# Patient Record
Sex: Female | Born: 1970 | Race: White | Hispanic: No | Marital: Married | Smoking: Never smoker
Health system: Southern US, Community
[De-identification: ages and names within clinical notes are randomized; demographics above are authoritative.]

## PROBLEM LIST (undated history)

## (undated) DIAGNOSIS — Z01419 Encounter for gynecological examination (general) (routine) without abnormal findings: Secondary | ICD-10-CM

## (undated) DIAGNOSIS — R739 Hyperglycemia, unspecified: Secondary | ICD-10-CM

## (undated) DIAGNOSIS — E039 Hypothyroidism, unspecified: Secondary | ICD-10-CM

## (undated) HISTORY — DX: Hypothyroidism, unspecified: E03.9

## (undated) HISTORY — DX: Encounter for gynecological examination (general) (routine) without abnormal findings: Z01.419

## (undated) HISTORY — DX: Hyperglycemia, unspecified: R73.9

---

## 1999-06-11 HISTORY — PX: COLONOSCOPY: SHX174

## 1999-06-11 LAB — HM COLONOSCOPY

## 2004-05-02 ENCOUNTER — Ambulatory Visit: Payer: Self-pay | Admitting: Family Medicine

## 2004-05-11 ENCOUNTER — Ambulatory Visit: Payer: Self-pay | Admitting: Family Medicine

## 2004-06-26 ENCOUNTER — Other Ambulatory Visit: Admission: RE | Admit: 2004-06-26 | Discharge: 2004-06-26 | Payer: Self-pay | Admitting: Obstetrics and Gynecology

## 2004-07-04 ENCOUNTER — Ambulatory Visit: Payer: Self-pay | Admitting: Family Medicine

## 2004-08-29 ENCOUNTER — Ambulatory Visit: Payer: Self-pay | Admitting: Family Medicine

## 2004-11-08 ENCOUNTER — Other Ambulatory Visit: Admission: RE | Admit: 2004-11-08 | Discharge: 2004-11-08 | Payer: Self-pay | Admitting: Obstetrics and Gynecology

## 2005-04-18 ENCOUNTER — Ambulatory Visit: Payer: Self-pay | Admitting: Family Medicine

## 2005-05-22 ENCOUNTER — Other Ambulatory Visit: Admission: RE | Admit: 2005-05-22 | Discharge: 2005-05-22 | Payer: Self-pay | Admitting: Obstetrics and Gynecology

## 2006-04-17 ENCOUNTER — Ambulatory Visit: Payer: Self-pay | Admitting: Family Medicine

## 2007-04-08 ENCOUNTER — Ambulatory Visit: Payer: Self-pay | Admitting: Family Medicine

## 2007-11-12 ENCOUNTER — Ambulatory Visit: Payer: Self-pay | Admitting: Internal Medicine

## 2007-11-21 ENCOUNTER — Emergency Department (HOSPITAL_COMMUNITY): Admission: EM | Admit: 2007-11-21 | Discharge: 2007-11-21 | Payer: Self-pay | Admitting: Emergency Medicine

## 2007-12-21 ENCOUNTER — Ambulatory Visit: Payer: Self-pay | Admitting: Family Medicine

## 2007-12-21 DIAGNOSIS — I803 Phlebitis and thrombophlebitis of lower extremities, unspecified: Secondary | ICD-10-CM | POA: Insufficient documentation

## 2007-12-30 ENCOUNTER — Encounter: Payer: Self-pay | Admitting: Family Medicine

## 2007-12-30 ENCOUNTER — Ambulatory Visit: Payer: Self-pay

## 2008-01-26 ENCOUNTER — Ambulatory Visit: Payer: Self-pay | Admitting: Family Medicine

## 2008-01-26 LAB — CONVERTED CEMR LAB
Bilirubin Urine: NEGATIVE
Glucose, Urine, Semiquant: NEGATIVE
Ketones, urine, test strip: NEGATIVE
Nitrite: NEGATIVE
Specific Gravity, Urine: 1.015
pH: 5.5

## 2008-01-28 LAB — CONVERTED CEMR LAB
Albumin: 4.2 g/dL (ref 3.5–5.2)
Alkaline Phosphatase: 36 units/L — ABNORMAL LOW (ref 39–117)
BUN: 9 mg/dL (ref 6–23)
Cholesterol: 133 mg/dL (ref 0–200)
Creatinine, Ser: 0.7 mg/dL (ref 0.4–1.2)
Eosinophils Absolute: 0.3 10*3/uL (ref 0.0–0.7)
Eosinophils Relative: 4.8 % (ref 0.0–5.0)
GFR calc Af Amer: 122 mL/min
GFR calc non Af Amer: 101 mL/min
HCT: 38.7 % (ref 36.0–46.0)
HDL: 37.3 mg/dL — ABNORMAL LOW (ref >39.0)
MCHC: 34.6 g/dL (ref 30.0–36.0)
MCV: 88.5 fL (ref 78.0–100.0)
Monocytes Absolute: 0.4 10*3/uL (ref 0.1–1.0)
Platelets: 153 10*3/uL (ref 150–400)
Potassium: 4.4 meq/L (ref 3.5–5.1)
TSH: 5.01 microintl units/mL (ref 0.35–5.50)
Triglycerides: 67 mg/dL (ref 0–149)
WBC: 6.1 10*3/uL (ref 4.5–10.5)

## 2008-02-02 ENCOUNTER — Ambulatory Visit: Payer: Self-pay | Admitting: Family Medicine

## 2008-03-10 ENCOUNTER — Ambulatory Visit: Payer: Self-pay | Admitting: Family Medicine

## 2008-06-09 ENCOUNTER — Telehealth: Payer: Self-pay | Admitting: Family Medicine

## 2008-06-09 ENCOUNTER — Emergency Department (HOSPITAL_COMMUNITY): Admission: EM | Admit: 2008-06-09 | Discharge: 2008-06-09 | Payer: Self-pay | Admitting: Internal Medicine

## 2008-06-09 ENCOUNTER — Encounter: Payer: Self-pay | Admitting: Family Medicine

## 2008-06-21 ENCOUNTER — Telehealth: Payer: Self-pay | Admitting: Family Medicine

## 2008-06-21 ENCOUNTER — Ambulatory Visit: Payer: Self-pay | Admitting: Family Medicine

## 2008-06-21 DIAGNOSIS — J189 Pneumonia, unspecified organism: Secondary | ICD-10-CM | POA: Insufficient documentation

## 2008-06-24 ENCOUNTER — Telehealth: Payer: Self-pay | Admitting: Family Medicine

## 2009-01-20 ENCOUNTER — Ambulatory Visit: Payer: Self-pay | Admitting: Family Medicine

## 2009-01-20 DIAGNOSIS — R079 Chest pain, unspecified: Secondary | ICD-10-CM | POA: Insufficient documentation

## 2009-01-25 ENCOUNTER — Ambulatory Visit: Payer: Self-pay | Admitting: Cardiology

## 2009-01-25 LAB — CONVERTED CEMR LAB
ALT: 15 units/L (ref 0–35)
Basophils Relative: 0.1 % (ref 0.0–3.0)
Chloride: 103 meq/L (ref 96–112)
Eosinophils Relative: 1 % (ref 0.0–5.0)
HCT: 40.9 % (ref 36.0–46.0)
Hemoglobin: 13.7 g/dL (ref 12.0–15.0)
Lymphs Abs: 1.3 10*3/uL (ref 0.7–4.0)
MCV: 89.8 fL (ref 78.0–100.0)
Monocytes Absolute: 0.3 10*3/uL (ref 0.1–1.0)
Potassium: 4.9 meq/L (ref 3.5–5.1)
RBC: 4.55 M/uL (ref 3.87–5.11)
TSH: 2.21 microintl units/mL (ref 0.35–5.50)
Total Protein: 7.4 g/dL (ref 6.0–8.3)
Vitamin B-12: 558 pg/mL (ref 211–911)
WBC: 7 10*3/uL (ref 4.5–10.5)

## 2009-01-30 ENCOUNTER — Encounter: Admission: RE | Admit: 2009-01-30 | Discharge: 2009-01-30 | Payer: Self-pay | Admitting: Family Medicine

## 2009-01-30 LAB — HM MAMMOGRAPHY: HM Mammogram: NEGATIVE

## 2009-08-31 ENCOUNTER — Ambulatory Visit: Payer: Self-pay | Admitting: Family Medicine

## 2009-08-31 DIAGNOSIS — IMO0001 Reserved for inherently not codable concepts without codable children: Secondary | ICD-10-CM | POA: Insufficient documentation

## 2009-09-01 ENCOUNTER — Ambulatory Visit: Payer: Self-pay | Admitting: Family Medicine

## 2009-09-05 LAB — CONVERTED CEMR LAB
Albumin: 4.9 g/dL (ref 3.5–5.2)
Alkaline Phosphatase: 38 units/L — ABNORMAL LOW (ref 39–117)
BUN: 11 mg/dL (ref 6–23)
Basophils Absolute: 0 10*3/uL (ref 0.0–0.1)
Basophils Relative: 1 % (ref 0–1)
Calcium: 9.3 mg/dL (ref 8.4–10.5)
Creatinine, Ser: 0.64 mg/dL (ref 0.40–1.20)
Eosinophils Relative: 4 % (ref 0–5)
Glucose, Bld: 128 mg/dL — ABNORMAL HIGH (ref 70–99)
HCT: 40.7 % (ref 36.0–46.0)
Lymphocytes Relative: 33 % (ref 12–46)
MCHC: 33.2 g/dL (ref 30.0–36.0)
Platelets: 182 10*3/uL (ref 150–400)
RDW: 12.6 % (ref 11.5–15.5)
Sodium: 141 meq/L (ref 135–145)
TSH: 4.733 microintl units/mL — ABNORMAL HIGH (ref 0.350–4.500)
Total Bilirubin: 0.4 mg/dL (ref 0.3–1.2)
Total CK: 42 units/L (ref 7–177)
Total Protein: 7.5 g/dL (ref 6.0–8.3)
Vitamin B-12: 672 pg/mL (ref 211–911)

## 2009-10-03 ENCOUNTER — Ambulatory Visit: Payer: Self-pay | Admitting: Family Medicine

## 2009-10-03 DIAGNOSIS — H6690 Otitis media, unspecified, unspecified ear: Secondary | ICD-10-CM | POA: Insufficient documentation

## 2009-10-09 ENCOUNTER — Telehealth: Payer: Self-pay | Admitting: Family Medicine

## 2009-11-20 IMAGING — CR DG CHEST 2V
2 series · 2 of 2 positions shown · non-contrast
Comparison: 11/21/2007

CLINICAL DATA: Fever, sore throat, cough

CHEST - 2 VIEW

[w chest pa]
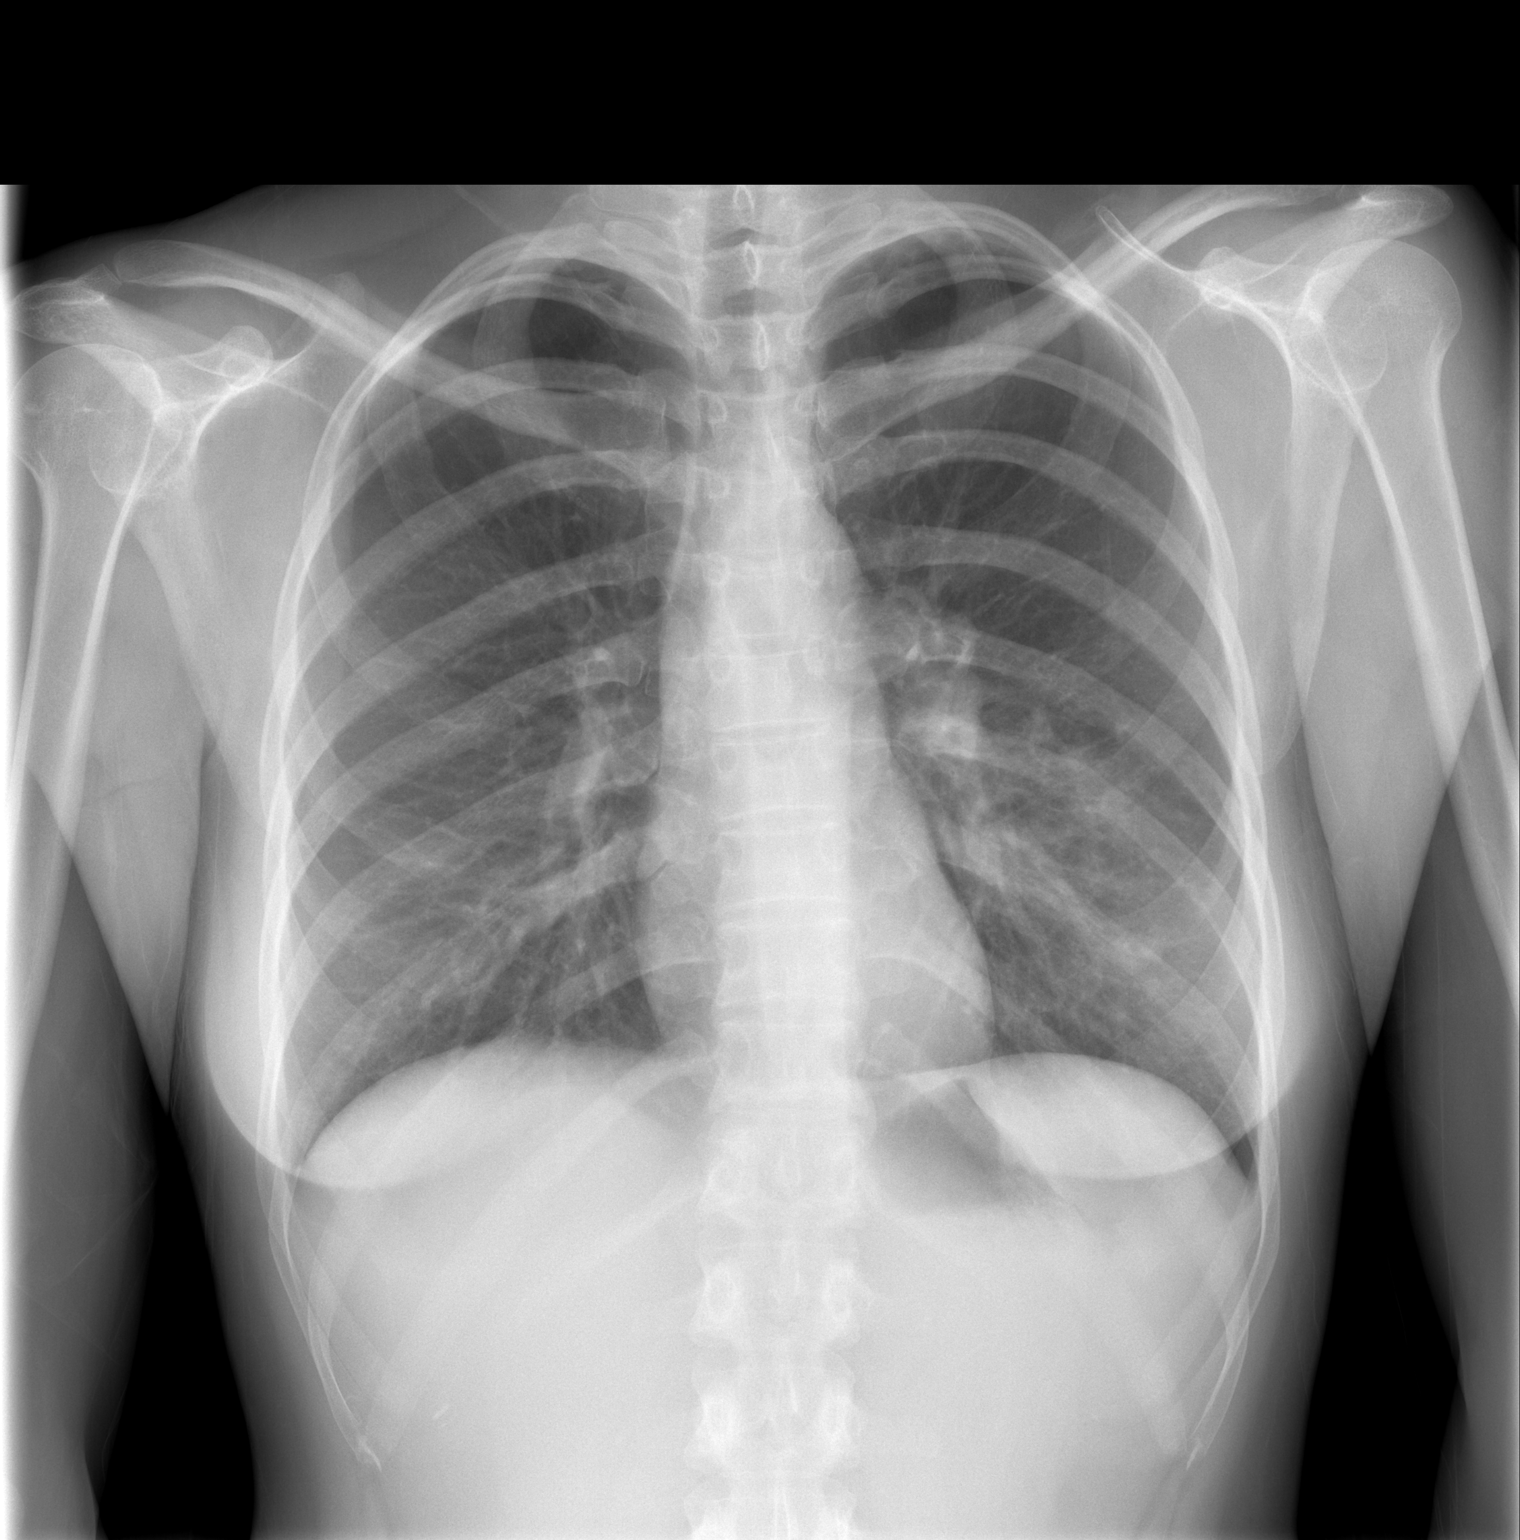

[w chest lat]
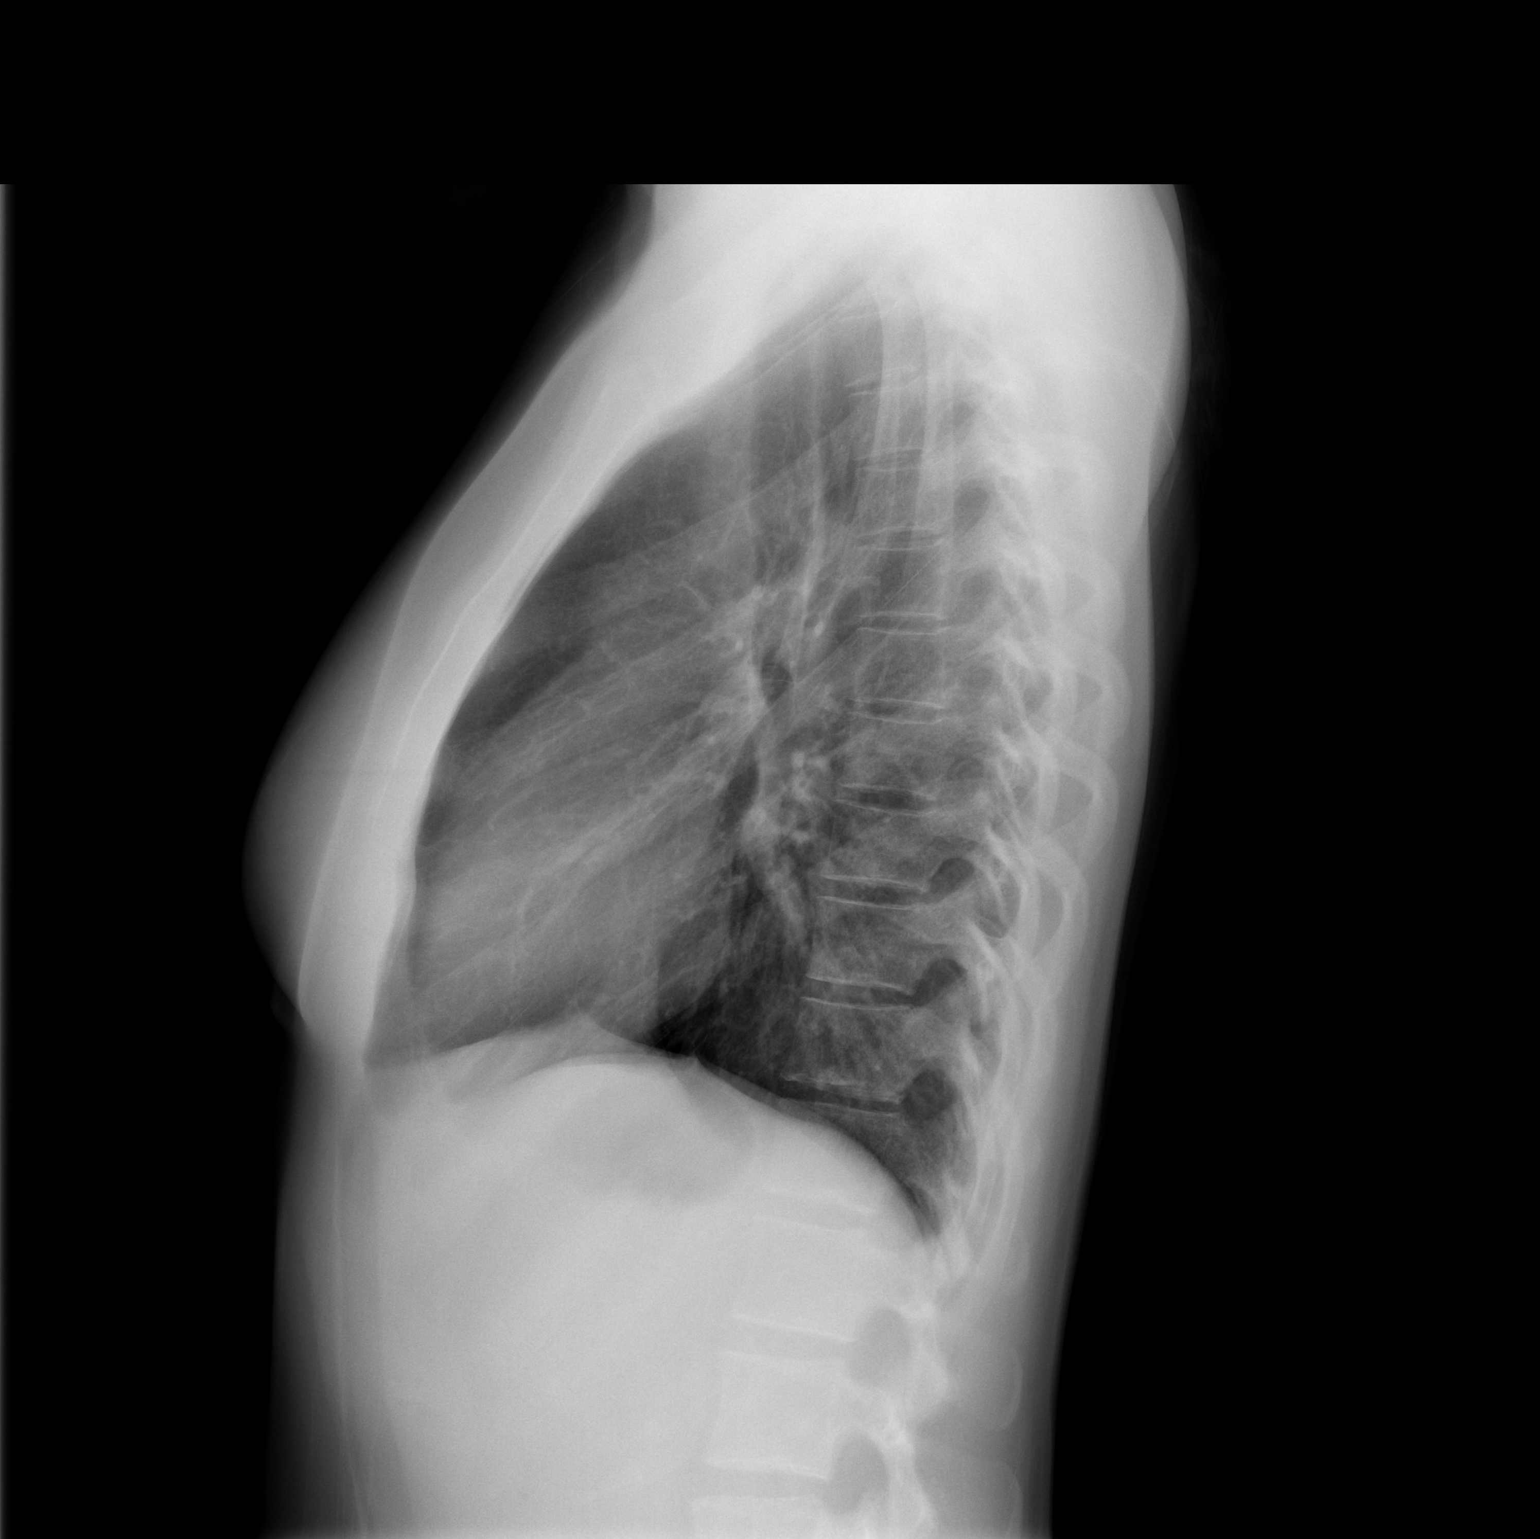

[2 of 2 positions shown; findings below may reference images not displayed]

FINDINGS: Cardiomediastinal silhouette is stable.  There is mild
central increased bronchial markings without peribronchial
thickening.  There is patchy haziness in the lingula suspicious for
early infiltrate. No pleural effusion or pulmonary edema.
IMPRESSION: Patchy haziness is noted in the lingula suspicious for early
infiltrate.  Mild central increased bronchial markings without
peribronchial thickening.

## 2009-11-27 ENCOUNTER — Ambulatory Visit: Payer: Self-pay | Admitting: Family Medicine

## 2009-11-28 LAB — CONVERTED CEMR LAB
TSH: 1.22 microintl units/mL (ref 0.35–5.50)
Vit D, 25-Hydroxy: 44 ng/mL (ref 30–89)

## 2009-11-29 ENCOUNTER — Telehealth: Payer: Self-pay | Admitting: Family Medicine

## 2010-03-15 ENCOUNTER — Telehealth: Payer: Self-pay | Admitting: Family Medicine

## 2010-07-11 NOTE — Progress Notes (Signed)
Summary: Levothyroxine?  Phone Note Call from Patient Call back at [facility] Phone [phone] Call back at Work Phone [phone]   Caller: Patient Call For: Nelwyn Salisbury MD Summary of Call: Pt is asking how long she should be losing this amount of hair loss on Levothyroxine???? Initial call taken by: Lynann Beaver CMA,  March 15, 2010 10:39 AM  Follow-up for Phone Call        I am not sure, but her thyroid level is good. She may want to talk to Dr. Vincente Poli about checking her hormone levels  Follow-up by: Nelwyn Salisbury MD,  March 16, 2010 8:43 AM  Additional Follow-up for Phone Call Additional follow up Details #1::        Pt. notified. Additional Follow-up by: Lynann Beaver CMA,  March 16, 2010 8:51 AM

## 2010-07-11 NOTE — Progress Notes (Signed)
Summary: ear still clogged.  Phone Note Call from Patient   Caller: Patient Call For: Nelwyn Salisbury MD Summary of Call: [phone] Ear is still clogged, and wants to know if this is normal.  Concerned about it lasting this long. Initial call taken by: Lynann Beaver CMA,  Oct 09, 2009 10:30 AM  Follow-up for Phone Call        It should be better by now, so Ceftin  is not working. Stop this and  try Levaquin 500 mg once daily for 10 days. Please call this in Follow-up by: Nelwyn Salisbury MD,  Oct 09, 2009 12:52 PM  Additional Follow-up for Phone Call Additional follow up Details #1::        Rx Called In, patient notified. Additional Follow-up by: Raechel Ache, RN,  Oct 09, 2009 1:27 PM

## 2010-07-11 NOTE — Assessment & Plan Note (Signed)
Summary: F/U ON ARM/NECK PAIN // RS   Vital Signs:  Patient profile:   40 year old female Weight:      145 pounds BMI:     22.13 Temp:     97.9 degrees F oral BP sitting:   110 / 76  (left arm) Cuff size:   regular  Vitals Entered By: Raechel Ache, RN (August 31, 2009 8:27 AM) CC: F/u; c/o pains in gland areas- neck, axilla and groin.   History of Present Illness: Here for continued intermittent vague pains in the left neck and left axilla and left groin which have persisted for the past year. We worked this up last August with labs, a mammogram, and even a chest CT. All this was normal. These pains are mild, never severe. She has never taken anything for them. She has never felt a discrete lump anywhere. She is very frustrated and a bit alarmed at what these could be coming from. never ever bothers her on the right side of her body. No sweats or fevers or rashes. her weight is steady.   Allergies: 1)  ! Penicillin V Potassium (Penicillin V Potassium)  Past History:  Past Medical History: Reviewed history from 02/02/2008 and no changes required. Unremarkable sees Dr. Vincente Poli for gyn exams  Past Surgical History: Reviewed history from 02/02/2008 and no changes required. Colonoscopy-2001 revealed internal hemorrhoids only  Review of Systems  The patient denies anorexia, fever, weight loss, weight gain, vision loss, decreased hearing, hoarseness, chest pain, syncope, dyspnea on exertion, peripheral edema, prolonged cough, headaches, hemoptysis, abdominal pain, melena, hematochezia, severe indigestion/heartburn, hematuria, incontinence, genital sores, muscle weakness, suspicious skin lesions, transient blindness, difficulty walking, depression, unusual weight change, abnormal bleeding, enlarged lymph nodes, angioedema, breast masses, and testicular masses.    Physical Exam  General:  Well-developed,well-nourished,in no acute distress; alert,appropriate and cooperative throughout  examination Neck:  No deformities, masses, or tenderness noted. Lungs:  Normal respiratory effort, chest expands symmetrically. Lungs are clear to auscultation, no crackles or wheezes. Heart:  Normal rate and regular rhythm. S1 and S2 normal without gallop, murmur, click, rub or other extra sounds. Abdomen:  Bowel sounds positive,abdomen soft and non-tender without masses, organomegaly or hernias noted. Skin:  Intact without suspicious lesions or rashes Cervical Nodes:  No lymphadenopathy noted Axillary Nodes:  No palpable lymphadenopathy Inguinal Nodes:  No significant adenopathy   Impression & Recommendations:  Problem # 1:  MYALGIA (ICD-729.1)  Patient Instructions: 1)  It is very difficult to say what the etiology of this could be. We have never felt any true lymphadenopathy, although the areas where she is symptomatic seem to be where lymph nodes predominate. She will return tomorrow for a number of labs to evaluate further.

## 2010-07-11 NOTE — Progress Notes (Signed)
Summary: returning a call  Phone Note Call from Patient Call back at Work Phone [phone]   Caller: Patient---live call Summary of Call: returning a call. Initial call taken by: Warnell Forester,  November 29, 2009 12:49 PM  Follow-up for Phone Call        Phone Call Completed Follow-up by: Raechel Ache, RN,  November 29, 2009 1:19 PM    Prescriptions: SYNTHROID 50 MCG TABS (LEVOTHYROXINE SODIUM) 1 once daily  #90 x 3   Entered by:   Raechel Ache, RN   Authorized by:   Nelwyn Salisbury MD   Signed by:   Raechel Ache, RN on 11/29/2009   Method used:   [facility]  [place] [address] (retail[facility]       [address]       [place], [place]  [address]       Ph: [phone] or [phone]       Fax: [phone]   RxID:   [id number]

## 2010-07-11 NOTE — Assessment & Plan Note (Signed)
Summary: LG FEVER, ST, EAR PAIN // RS   Vital Signs:  Patient profile:   40 year old female Weight:      147 pounds Temp:     98.7 degrees F oral BP sitting:   114 / 86  (left arm) Cuff size:   regular  Vitals Entered By: Raechel Ache, RN (October 03, 2009 11:34 AM) CC: C/o congestion, R earache, sore throat, little cough.   History of Present Illness: Here for one week of HA, fever, ST, and a dry cough, most of which has resolved. Now she also has a painful right ear. On Sudafed and Motrin.   Allergies: 1)  ! Penicillin V Potassium (Penicillin V Potassium)  Past History:  Past Medical History: Reviewed history from 02/02/2008 and no changes required. Unremarkable sees Dr. Vincente Poli for gyn exams  Past Surgical History: Reviewed history from 02/02/2008 and no changes required. Colonoscopy-2001 revealed internal hemorrhoids only  Review of Systems  The patient denies anorexia, weight loss, weight gain, vision loss, decreased hearing, hoarseness, chest pain, syncope, dyspnea on exertion, peripheral edema, hemoptysis, abdominal pain, melena, hematochezia, severe indigestion/heartburn, hematuria, incontinence, genital sores, muscle weakness, suspicious skin lesions, transient blindness, difficulty walking, depression, unusual weight change, abnormal bleeding, enlarged lymph nodes, angioedema, breast masses, and testicular masses.    Physical Exam  General:  Well-developed,well-nourished,in no acute distress; alert,appropriate and cooperative throughout examination Head:  Normocephalic and atraumatic without obvious abnormalities. No apparent alopecia or balding. Eyes:  No corneal or conjunctival inflammation noted. EOMI. Perrla. Funduscopic exam benign, without hemorrhages, exudates or papilledema. Vision grossly normal. Ears:  right TM is red and dull, left is clear Nose:  External nasal examination shows no deformity or inflammation. Nasal mucosa are pink and moist without  lesions or exudates. Mouth:  Oral mucosa and oropharynx without lesions or exudates.  Teeth in good repair. Neck:  No deformities, masses, or tenderness noted. Lungs:  Normal respiratory effort, chest expands symmetrically. Lungs are clear to auscultation, no crackles or wheezes.   Impression & Recommendations:  Problem # 1:  OTITIS MEDIA (ICD-382.9)  Her updated medication list for this problem includes:    Ceftin 500 Mg Tabs (Cefuroxime axetil) .Marland Kitchen..Marland Kitchen Two times a day  Complete Medication List: 1)  Vitamin D (ergocalciferol) 50000 Unit Caps (Ergocalciferol) .Marland Kitchen.. 1 q week 2)  Synthroid 50 Mcg Tabs (Levothyroxine sodium) .Marland Kitchen.. 1 once daily 3)  Ceftin 500 Mg Tabs (Cefuroxime axetil) .... Two times a day  Patient Instructions: 1)  Please schedule a follow-up appointment as needed .  Prescriptions: CEFTIN 500 MG TABS (CEFUROXIME AXETIL) two times a day  #20 x 0   Entered and Authorized by:   Nelwyn Salisbury MD   Signed by:   Nelwyn Salisbury MD on 10/03/2009   Method used:   [facility]        [facility]  [place] [address] [facility]       [address]       [place], [place]  [address]       Ph: [phone] or [phone]       Fax: [phone]   RxID:   [id number]

## 2010-07-13 IMAGING — MG MM DIAGNOSTIC BILATERAL
5 series · 5 of 5 positions shown · non-contrast
Comparison: None.

CLINICAL DATA: Intermittent bilateral, nonfocal breast pain,
greater on the left.

DIGITAL DIAGNOSTIC BILATERAL MAMMOGRAM WITH CAD

[R CC (1 of 2)]
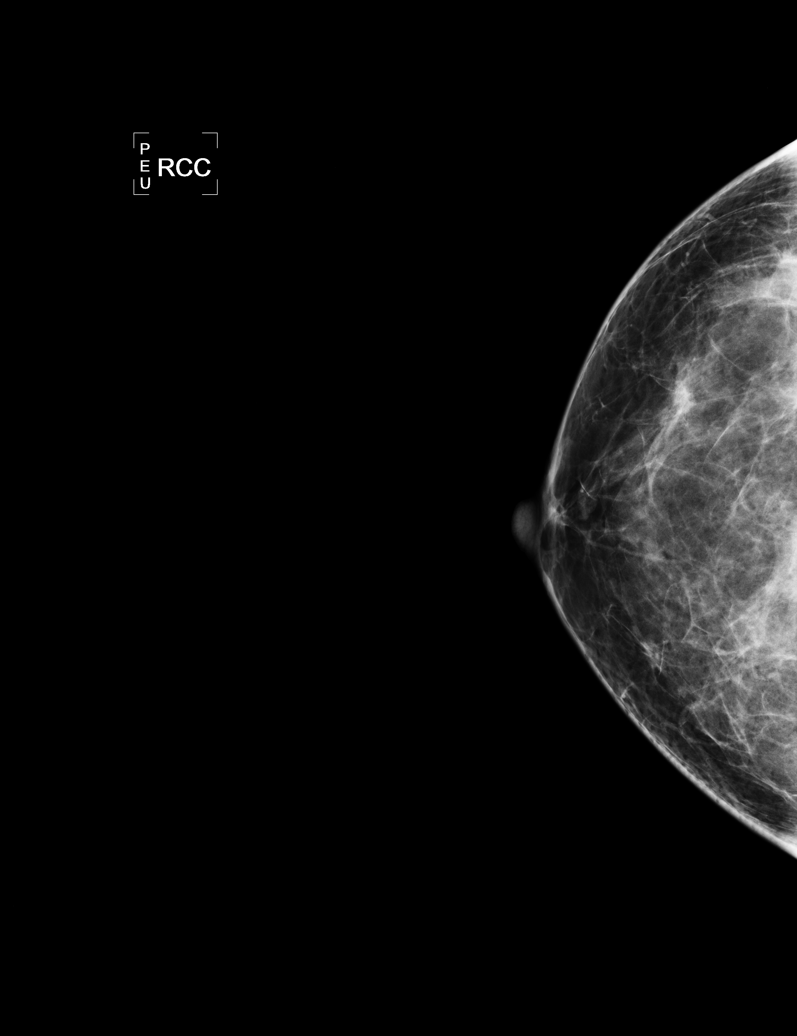

[L CC]
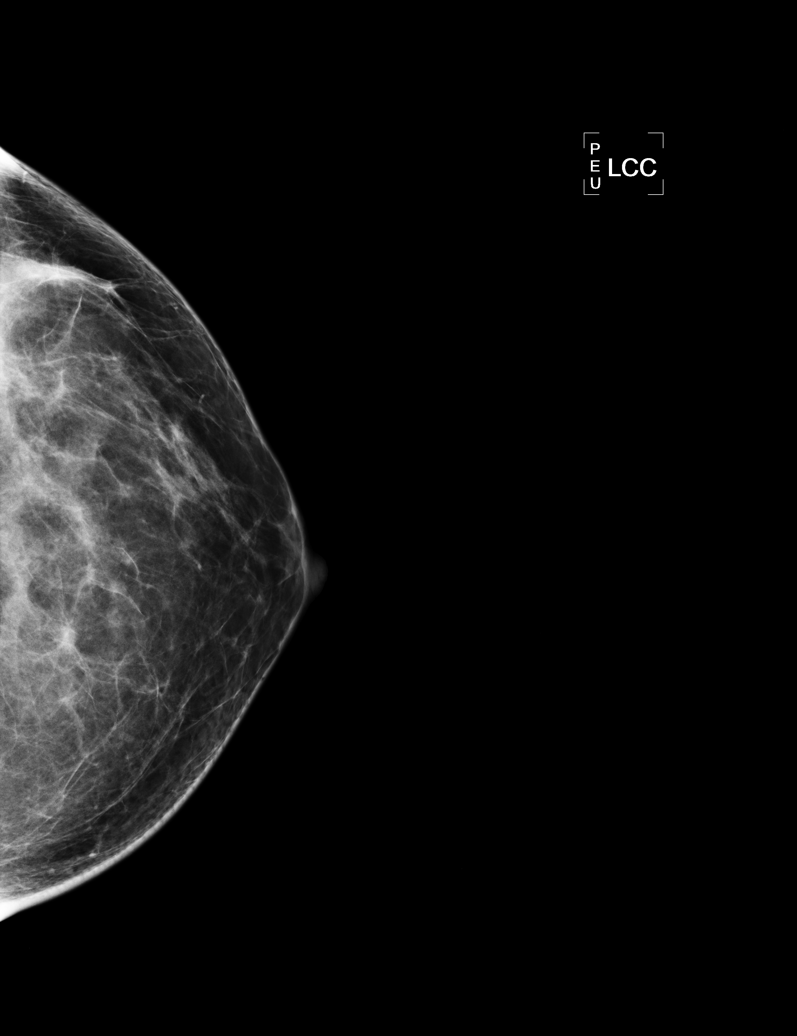

[L MLO]
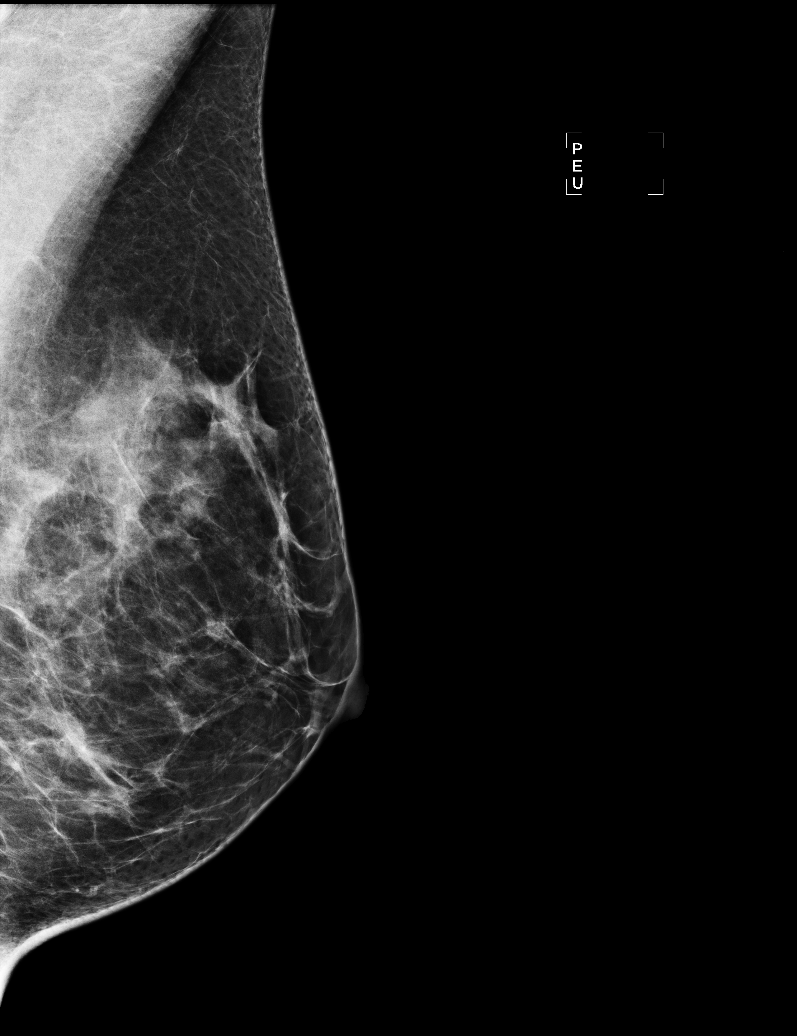

[R MLO]
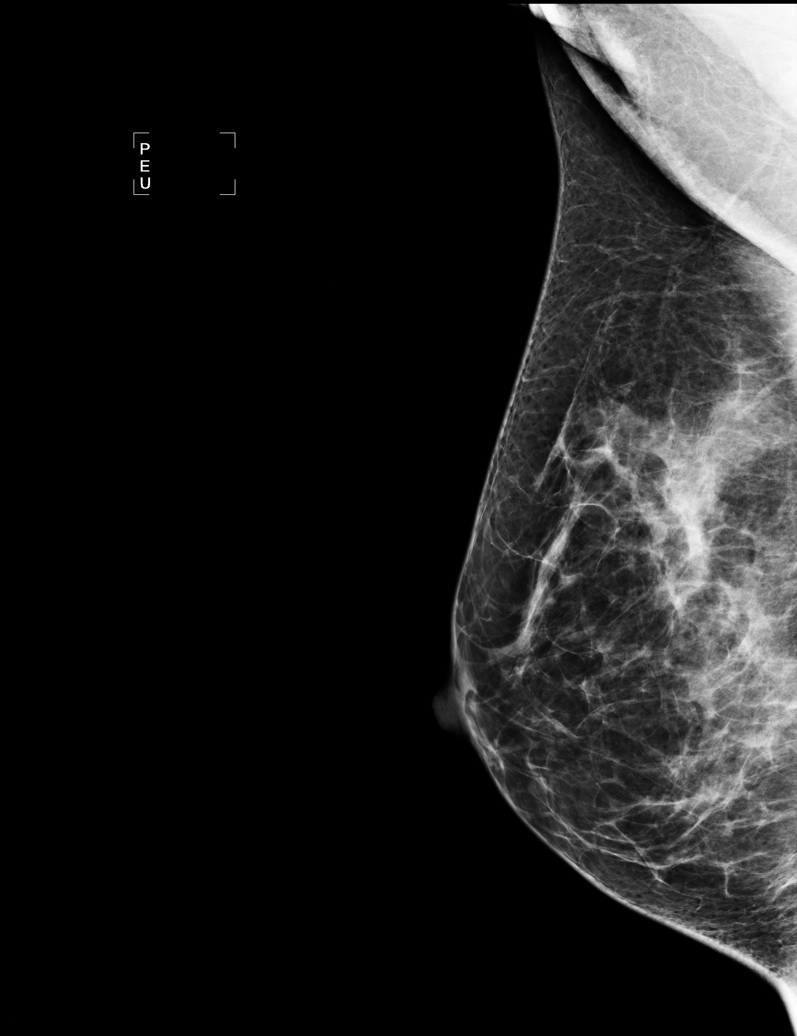

[R CC (2 of 2)]
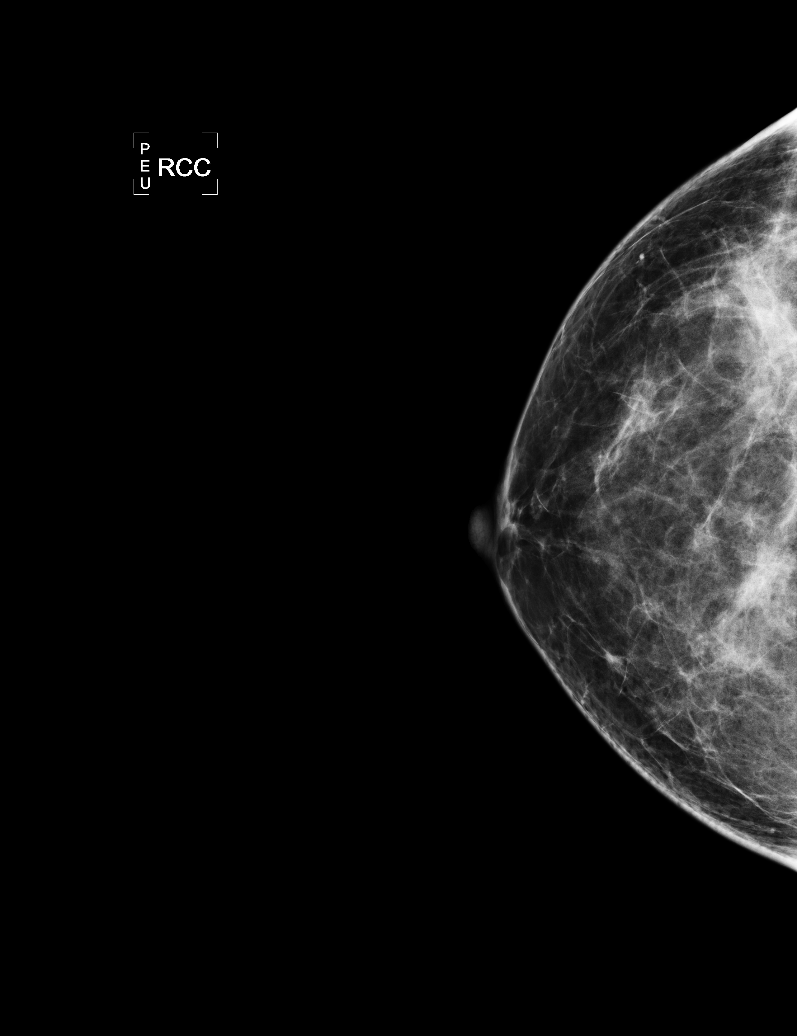

[5 of 5 positions shown; findings below may reference images not displayed]

FINDINGS: Scattered fibroglandular tissue in both breasts with no
mammographic findings suspicious for malignancy.
Mammographic images were processed with CAD.
IMPRESSION: No evidence of malignancy.  Annual screening mammography is
recommended beginning at age 40.

BI-RADS CATEGORY 1:  Negative.

## 2010-11-28 ENCOUNTER — Other Ambulatory Visit (INDEPENDENT_AMBULATORY_CARE_PROVIDER_SITE_OTHER): Payer: BC Managed Care – PPO | Admitting: Family Medicine

## 2010-11-28 DIAGNOSIS — Z0000 Encounter for general adult medical examination without abnormal findings: Secondary | ICD-10-CM

## 2010-11-28 LAB — POCT URINALYSIS DIPSTICK
Bilirubin, UA: NEGATIVE
Glucose, UA: NEGATIVE
Ketones, UA: NEGATIVE
Nitrite, UA: NEGATIVE
pH, UA: 5.5

## 2010-11-28 LAB — CBC WITH DIFFERENTIAL/PLATELET
Basophils Relative: 0.4 % (ref 0.0–3.0)
Eosinophils Absolute: 0.2 10*3/uL (ref 0.0–0.7)
Eosinophils Relative: 3.6 % (ref 0.0–5.0)
Hemoglobin: 13.9 g/dL (ref 12.0–15.0)
MCHC: 33.7 g/dL (ref 30.0–36.0)
MCV: 89.7 fl (ref 78.0–100.0)
Monocytes Absolute: 0.3 10*3/uL (ref 0.1–1.0)
Neutro Abs: 4 10*3/uL (ref 1.4–7.7)
Neutrophils Relative %: 61.3 % (ref 43.0–77.0)
RBC: 4.61 Mil/uL (ref 3.87–5.11)
WBC: 6.5 10*3/uL (ref 4.5–10.5)

## 2010-11-28 LAB — HEPATIC FUNCTION PANEL
ALT: 18 U/L (ref 0–35)
Albumin: 4.5 g/dL (ref 3.5–5.2)
Alkaline Phosphatase: 35 U/L — ABNORMAL LOW (ref 39–117)
Bilirubin, Direct: 0.1 mg/dL (ref 0.0–0.3)
Total Protein: 7 g/dL (ref 6.0–8.3)

## 2010-11-28 LAB — BASIC METABOLIC PANEL
CO2: 29 mEq/L (ref 19–32)
Chloride: 104 mEq/L (ref 96–112)
Potassium: 5.6 mEq/L — ABNORMAL HIGH (ref 3.5–5.1)
Sodium: 140 mEq/L (ref 135–145)

## 2010-11-28 LAB — LIPID PANEL
HDL: 43.2 mg/dL (ref >39.00)
Total CHOL/HDL Ratio: 4

## 2010-11-28 LAB — TSH: TSH: 3.18 u[IU]/mL (ref 0.35–5.50)

## 2010-11-30 ENCOUNTER — Encounter: Payer: Self-pay | Admitting: *Deleted

## 2010-12-05 ENCOUNTER — Encounter: Payer: Self-pay | Admitting: Family Medicine

## 2010-12-05 ENCOUNTER — Ambulatory Visit (INDEPENDENT_AMBULATORY_CARE_PROVIDER_SITE_OTHER): Payer: BC Managed Care – PPO | Admitting: Family Medicine

## 2010-12-05 VITALS — BP 112/82 | HR 84 | Temp 98.2°F | Ht 68.0 in | Wt 140.0 lb

## 2010-12-05 DIAGNOSIS — Z0000 Encounter for general adult medical examination without abnormal findings: Secondary | ICD-10-CM

## 2010-12-05 DIAGNOSIS — E039 Hypothyroidism, unspecified: Secondary | ICD-10-CM

## 2010-12-05 MED ORDER — FLUTICASONE PROPIONATE 50 MCG/ACT NA SUSP: 2.0000 | Freq: Every day | NASAL | Status: DC

## 2010-12-05 MED ORDER — SYNTHROID 50 MCG PO TABS: 50.0000 ug | ORAL_TABLET | Freq: Every day | ORAL | Status: DC

## 2010-12-05 NOTE — Progress Notes (Signed)
  Subjective:    Patient ID: Caitlin Russell, female    DOB: December 24, 1970, 40 y.o.   MRN: [id number]  HPI 40 yr old female for a cpx. She is doing well in general although her allergies are acting up a bit. She has a stuffy head and nose, and she gets PND at times. No fever or cough.    Review of Systems  Constitutional: Negative.   HENT: Negative.   Eyes: Negative.   Respiratory: Negative.   Cardiovascular: Negative.   Gastrointestinal: Negative.   Genitourinary: Negative for dysuria, urgency, frequency, hematuria, flank pain, decreased urine volume, enuresis, difficulty urinating, pelvic pain and dyspareunia.  Musculoskeletal: Negative.   Skin: Negative.   Neurological: Negative.   Hematological: Negative.   Psychiatric/Behavioral: Negative.        Objective:   Physical Exam  Constitutional: She is oriented to person, place, and time. She appears well-developed and well-nourished. No distress.  HENT:  Head: Normocephalic and atraumatic.  Right Ear: External ear normal.  Left Ear: External ear normal.  Nose: Nose normal.  Mouth/Throat: Oropharynx is clear and moist. No oropharyngeal exudate.  Eyes: Conjunctivae and EOM are normal. Pupils are equal, round, and reactive to light. No scleral icterus.  Neck: Normal range of motion. Neck supple. No JVD present. No thyromegaly present.  Cardiovascular: Normal rate, regular rhythm, normal heart sounds and intact distal pulses.  Exam reveals no gallop and no friction rub.   No murmur heard. Pulmonary/Chest: Effort normal and breath sounds normal. No respiratory distress. She has no wheezes. She has no rales. She exhibits no tenderness.  Abdominal: Soft. Bowel sounds are normal. She exhibits no distension and no mass. There is no tenderness. There is no rebound and no guarding.  Musculoskeletal: Normal range of motion. She exhibits no edema and no tenderness.  Lymphadenopathy:    She has no cervical adenopathy.  Neurological: She is  alert and oriented to person, place, and time. She has normal reflexes. No cranial nerve deficit. She exhibits normal muscle tone. Coordination normal.  Skin: Skin is warm and dry. No rash noted. No erythema.  Psychiatric: She has a normal mood and affect. Her behavior is normal. Judgment and thought content normal.          Assessment & Plan:  Get more exercise. Try Flonase  sprays

## 2011-03-15 LAB — URINALYSIS, ROUTINE W REFLEX MICROSCOPIC
Ketones, ur: 40 mg/dL — AB
Nitrite: NEGATIVE
pH: 5.5 (ref 5.0–8.0)

## 2011-03-15 LAB — DIFFERENTIAL
Basophils Absolute: 0 10*3/uL (ref 0.0–0.1)
Basophils Relative: 0 % (ref 0–1)
Lymphocytes Relative: 5 % — ABNORMAL LOW (ref 12–46)
Monocytes Absolute: 0.3 10*3/uL (ref 0.1–1.0)
Neutro Abs: 9.1 10*3/uL — ABNORMAL HIGH (ref 1.7–7.7)
Neutrophils Relative %: 92 % — ABNORMAL HIGH (ref 43–77)

## 2011-03-15 LAB — CBC
MCHC: 33.6 g/dL (ref 30.0–36.0)
Platelets: 189 10*3/uL (ref 150–400)
RDW: 13 % (ref 11.5–15.5)

## 2011-03-15 LAB — URINE MICROSCOPIC-ADD ON

## 2011-03-15 LAB — URINE CULTURE: Colony Count: >=100000

## 2011-03-15 LAB — POCT I-STAT, CHEM 8
BUN: 7 mg/dL (ref 6–23)
Calcium, Ion: 1.07 mmol/L — ABNORMAL LOW (ref 1.12–1.32)
Chloride: 99 mEq/L (ref 96–112)
HCT: 45 % (ref 36.0–46.0)
Potassium: 3.3 mEq/L — ABNORMAL LOW (ref 3.5–5.1)
Sodium: 136 mEq/L (ref 135–145)

## 2011-03-15 LAB — RAPID STREP SCREEN (MED CTR MEBANE ONLY): Streptococcus, Group A Screen (Direct): NEGATIVE

## 2011-03-15 LAB — D-DIMER, QUANTITATIVE: D-Dimer, Quant: 0.42 ug/mL-FEU (ref 0.00–0.48)

## 2011-11-19 ENCOUNTER — Other Ambulatory Visit: Payer: Self-pay | Admitting: Family Medicine

## 2011-11-19 NOTE — Telephone Encounter (Signed)
Can we refill this? 

## 2012-01-08 ENCOUNTER — Other Ambulatory Visit (INDEPENDENT_AMBULATORY_CARE_PROVIDER_SITE_OTHER): Payer: BC Managed Care – PPO

## 2012-01-08 DIAGNOSIS — Z0000 Encounter for general adult medical examination without abnormal findings: Secondary | ICD-10-CM

## 2012-01-08 LAB — LIPID PANEL
Cholesterol: 130 mg/dL (ref 0–200)
HDL: 44.1 mg/dL (ref >39.00)
LDL Cholesterol: 79 mg/dL (ref 0–99)
Total CHOL/HDL Ratio: 3
Triglycerides: 34 mg/dL (ref 0.0–149.0)
VLDL: 6.8 mg/dL (ref 0.0–40.0)

## 2012-01-08 LAB — HEPATIC FUNCTION PANEL
ALT: 23 U/L (ref 0–35)
AST: 18 U/L (ref 0–37)
Alkaline Phosphatase: 30 U/L — ABNORMAL LOW (ref 39–117)
Bilirubin, Direct: 0.1 mg/dL (ref 0.0–0.3)
Total Protein: 6.9 g/dL (ref 6.0–8.3)

## 2012-01-08 LAB — CBC WITH DIFFERENTIAL/PLATELET
Basophils Relative: 0.5 % (ref 0.0–3.0)
Eosinophils Relative: 4.3 % (ref 0.0–5.0)
Lymphocytes Relative: 32.5 % (ref 12.0–46.0)
Monocytes Relative: 5.9 % (ref 3.0–12.0)
Neutrophils Relative %: 56.8 % (ref 43.0–77.0)
RBC: 4.4 Mil/uL (ref 3.87–5.11)
WBC: 5.4 10*3/uL (ref 4.5–10.5)

## 2012-01-08 LAB — BASIC METABOLIC PANEL
Calcium: 8.9 mg/dL (ref 8.4–10.5)
Chloride: 103 mEq/L (ref 96–112)
Creatinine, Ser: 0.8 mg/dL (ref 0.4–1.2)
Sodium: 138 mEq/L (ref 135–145)

## 2012-01-13 NOTE — Progress Notes (Signed)
Quick Note:  I spoke with pt ______ 

## 2012-01-14 ENCOUNTER — Encounter: Payer: Self-pay | Admitting: Family Medicine

## 2012-01-14 ENCOUNTER — Ambulatory Visit (INDEPENDENT_AMBULATORY_CARE_PROVIDER_SITE_OTHER): Payer: BC Managed Care – PPO | Admitting: Family Medicine

## 2012-01-14 VITALS — BP 102/66 | HR 83 | Temp 98.3°F | Ht 68.5 in | Wt 132.0 lb

## 2012-01-14 DIAGNOSIS — Z0000 Encounter for general adult medical examination without abnormal findings: Secondary | ICD-10-CM

## 2012-01-14 NOTE — Progress Notes (Signed)
  Subjective:    Patient ID: Caitlin Russell, female    DOB: Jun 30, 1970, 41 y.o.   MRN: [id number]  HPI 40 yr old female for a cpx. She feels fine and has no concerns.    Review of Systems  Constitutional: Negative.   HENT: Negative.   Eyes: Negative.   Respiratory: Negative.   Cardiovascular: Negative.   Gastrointestinal: Negative.   Genitourinary: Negative for dysuria, urgency, frequency, hematuria, flank pain, decreased urine volume, enuresis, difficulty urinating, pelvic pain and dyspareunia.  Musculoskeletal: Negative.   Skin: Negative.   Neurological: Negative.   Hematological: Negative.   Psychiatric/Behavioral: Negative.        Objective:   Physical Exam  Constitutional: She is oriented to person, place, and time. She appears well-developed and well-nourished. No distress.  HENT:  Head: Normocephalic and atraumatic.  Right Ear: External ear normal.  Left Ear: External ear normal.  Nose: Nose normal.  Mouth/Throat: Oropharynx is clear and moist. No oropharyngeal exudate.  Eyes: Conjunctivae and EOM are normal. Pupils are equal, round, and reactive to light. No scleral icterus.  Neck: Normal range of motion. Neck supple. No JVD present. No thyromegaly present.  Cardiovascular: Normal rate, regular rhythm, normal heart sounds and intact distal pulses.  Exam reveals no gallop and no friction rub.   No murmur heard. Pulmonary/Chest: Effort normal and breath sounds normal. No respiratory distress. She has no wheezes. She has no rales. She exhibits no tenderness.  Abdominal: Soft. Bowel sounds are normal. She exhibits no distension and no mass. There is no tenderness. There is no rebound and no guarding.  Musculoskeletal: Normal range of motion. She exhibits no edema and no tenderness.  Lymphadenopathy:    She has no cervical adenopathy.  Neurological: She is alert and oriented to person, place, and time. She has normal reflexes. No cranial nerve deficit. She exhibits  normal muscle tone. Coordination normal.  Skin: Skin is warm and dry. No rash noted. No erythema.  Psychiatric: She has a normal mood and affect. Her behavior is normal. Judgment and thought content normal.          Assessment & Plan:  Well exam.

## 2012-03-10 ENCOUNTER — Ambulatory Visit (INDEPENDENT_AMBULATORY_CARE_PROVIDER_SITE_OTHER): Payer: BC Managed Care – PPO | Admitting: Family Medicine

## 2012-03-10 ENCOUNTER — Encounter: Payer: Self-pay | Admitting: Family Medicine

## 2012-03-10 VITALS — BP 102/60 | HR 104 | Temp 98.5°F | Wt 137.0 lb

## 2012-03-10 DIAGNOSIS — M545 Low back pain, unspecified: Secondary | ICD-10-CM

## 2012-03-10 DIAGNOSIS — Z23 Encounter for immunization: Secondary | ICD-10-CM

## 2012-03-10 DIAGNOSIS — M25559 Pain in unspecified hip: Secondary | ICD-10-CM

## 2012-03-10 MED ORDER — DICLOFENAC SODIUM 75 MG PO TBEC: 75.0000 mg | DELAYED_RELEASE_TABLET | Freq: Two times a day (BID) | ORAL | Status: DC

## 2012-03-10 NOTE — Progress Notes (Signed)
  Subjective:    Patient ID: Caitlin Russell, female    DOB: 08-Apr-1971, 41 y.o.   MRN: [id number]  HPI Here for several pains that started about 6 weeks ago. Around this time she and her husband were spending a lot of time digging trenches in their yard for some pipes to be laid. Since then she has had a recurrent dull pain in the lower back and some stiffness. This does not radiate to the legs. No numbness or weakness in the legs. Advil relieves this pain for several hours. She also notes a sharp intermittent pain in the left anterior hip region. No recent trauma.    Review of Systems  Constitutional: Negative.   Musculoskeletal: Positive for back pain and arthralgias.       Objective:   Physical Exam  Constitutional: She appears well-developed and well-nourished.  Musculoskeletal:       Mildly tender in the lower back especially over the sacroiliac joints. The spine shows full ROM. Negative SLR. She is also tender over the left anterior superior iliac spine. Both hips show full ROM without pain.           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 Rest, ice. Try Diclofenac bid for several weeks. Recheck if this does not help.

## 2012-04-23 ENCOUNTER — Other Ambulatory Visit: Payer: Self-pay | Admitting: Family Medicine

## 2012-07-17 ENCOUNTER — Encounter: Payer: Self-pay | Admitting: Family Medicine

## 2012-07-17 ENCOUNTER — Ambulatory Visit (INDEPENDENT_AMBULATORY_CARE_PROVIDER_SITE_OTHER): Payer: BC Managed Care – PPO | Admitting: Family Medicine

## 2012-07-17 VITALS — BP 110/64 | HR 88 | Temp 98.4°F | Wt 138.0 lb

## 2012-07-17 DIAGNOSIS — M542 Cervicalgia: Secondary | ICD-10-CM

## 2012-07-17 DIAGNOSIS — R1032 Left lower quadrant pain: Secondary | ICD-10-CM

## 2012-07-17 MED ORDER — CYCLOBENZAPRINE HCL 10 MG PO TABS: 10.0000 mg | ORAL_TABLET | Freq: Three times a day (TID) | ORAL | Status: DC | PRN

## 2012-07-17 NOTE — Progress Notes (Signed)
  Subjective:    Patient ID: Caitlin Russell, female    DOB: 10/04/1970, 42 y.o.   MRN: [id number]  HPI Here for 2 things. First she has had stiffness and pain in there neck for several weeks. This started in the back of the neck but now has mover around to the left side. No masses or swelling. No pain on chewing. No HAs or ST. She has been under a lot of stress this winter. She and her husband opened up their home to her mother-in-law who was dying of cancer. She was enrolled in Hospice and lived with them for 7 weeks before she passed away last month. Needless to say this was stressful for everyone. She has taken Advil a few times for this and it helps. Also several days ago she had a sharp but mild pain appear in the LLQ of her abdomen. This lasted about 2 days and now today is has almost resolved. No change in bowel habits or urinations. No fever or nausea. Her LMP was 2 weeks ago.    Review of Systems  Constitutional: Negative.   HENT: Positive for neck pain and neck stiffness.   Eyes: Negative.   Respiratory: Negative.   Gastrointestinal: Positive for abdominal pain. Negative for nausea, vomiting, diarrhea, constipation, blood in stool and abdominal distention.  Genitourinary: Negative.        Objective:   Physical Exam  Constitutional: She appears well-developed and well-nourished. No distress.  HENT:  Head: Normocephalic and atraumatic.  Right Ear: External ear normal.  Left Ear: External ear normal.  Mouth/Throat: Oropharynx is clear and moist. No oropharyngeal exudate.  Eyes: Conjunctivae normal are normal. Pupils are equal, round, and reactive to light.  Neck: Normal range of motion. Neck supple. No thyromegaly present.       Tender along the left sternocleidomastoid muscle, no masses or adenopathy felt   Abdominal: Soft. Bowel sounds are normal. She exhibits no distension and no mass. There is no tenderness. There is no rebound and no guarding.  Lymphadenopathy:    She has  no cervical adenopathy.          Assessment & Plan:  The neck pain is probably secondary to stress and possibly sleeping in a strange position. Use moist heat and Diclofenac. Given Flexeril to use at bedtime every night, and possibly during the day as well. Try getting a massage. Her abdominal pain was likely ovulation pain and it seems to have resolved. Recheck prn

## 2012-09-30 ENCOUNTER — Ambulatory Visit (INDEPENDENT_AMBULATORY_CARE_PROVIDER_SITE_OTHER): Payer: BC Managed Care – PPO | Admitting: Family Medicine

## 2012-09-30 ENCOUNTER — Encounter: Payer: Self-pay | Admitting: Family Medicine

## 2012-09-30 ENCOUNTER — Telehealth: Payer: Self-pay | Admitting: Family Medicine

## 2012-09-30 VITALS — BP 112/70 | HR 98 | Temp 98.4°F | Wt 134.0 lb

## 2012-09-30 DIAGNOSIS — R1032 Left lower quadrant pain: Secondary | ICD-10-CM

## 2012-09-30 NOTE — Telephone Encounter (Signed)
Had to reorder ultrasound of the pelvis, per Dr. Clent Ridges request.

## 2012-09-30 NOTE — Addendum Note (Signed)
Addended by: Gershon Crane A on: 09/30/2012 01:38 PM   Modules accepted: Orders

## 2012-09-30 NOTE — Progress Notes (Signed)
  Subjective:    Patient ID: Caitlin Russell, female    DOB: 1971-03-22, 42 y.o.   MRN: [id number]  HPI Here for ongoing LLQ pains that started about 2 and 1/2 months ago. She mentioned this to me in February, and they had started 2 days prior to that. We decided to follow up prn. The pains have persisted intermittently, and there are days at a time when she feels nothing at all. Other times she feels a moderate pain in the area, never severe. This seems to be worst around a week after each menstrual cycle. Her last MP was April 14-17. No urinary or bowel symptoms. No fevers or nausea. She is scheduled to have her yearly GYN exam with Dr. Vincente Poli in June.    Review of Systems  Constitutional: Negative.   Respiratory: Negative.   Cardiovascular: Negative.   Gastrointestinal: Positive for abdominal pain. Negative for nausea, vomiting, diarrhea, constipation, blood in stool, abdominal distention and rectal pain.  Genitourinary: Negative.        Objective:   Physical Exam  Constitutional: She appears well-developed and well-nourished. No distress.  Pulmonary/Chest: Effort normal and breath sounds normal.  Abdominal: Soft. Bowel sounds are normal. She exhibits no distension and no mass. There is no rebound and no guarding.  Slightly tender in the LLQ          Assessment & Plan:  This is most consistent with an ovarian cyst. We will set up a transvaginal pelvic US to evaluate.

## 2012-10-05 ENCOUNTER — Ambulatory Visit
Admission: RE | Admit: 2012-10-05 | Discharge: 2012-10-05 | Disposition: A | Discharge status: Home / Self Care | Payer: BC Managed Care – PPO | Source: Ambulatory Visit | Attending: Family Medicine | Admitting: Family Medicine

## 2012-10-05 DIAGNOSIS — R1032 Left lower quadrant pain: Secondary | ICD-10-CM

## 2012-10-06 NOTE — Progress Notes (Signed)
pti nformed and will let us know if she decides to go to [facility]

## 2012-11-09 ENCOUNTER — Telehealth: Payer: Self-pay | Admitting: Family Medicine

## 2012-11-09 MED ORDER — SYNTHROID 50 MCG PO TABS: 50.0000 ug | ORAL_TABLET | Freq: Every day | ORAL | Status: DC

## 2012-11-09 NOTE — Telephone Encounter (Signed)
Refill request for Synthroid a 90 day supply and I did send script e-scribe.

## 2013-01-28 ENCOUNTER — Other Ambulatory Visit (INDEPENDENT_AMBULATORY_CARE_PROVIDER_SITE_OTHER): Payer: BC Managed Care – PPO

## 2013-01-28 DIAGNOSIS — Z0000 Encounter for general adult medical examination without abnormal findings: Secondary | ICD-10-CM

## 2013-01-28 LAB — LIPID PANEL
Total CHOL/HDL Ratio: 3
Triglycerides: 47 mg/dL (ref 0.0–149.0)

## 2013-01-28 LAB — HEPATIC FUNCTION PANEL
AST: 15 U/L (ref 0–37)
Albumin: 4.3 g/dL (ref 3.5–5.2)
Total Protein: 7.3 g/dL (ref 6.0–8.3)

## 2013-01-28 LAB — POCT URINALYSIS DIPSTICK
Glucose, UA: NEGATIVE
Nitrite, UA: NEGATIVE
Protein, UA: NEGATIVE
Urobilinogen, UA: 0.2
pH, UA: 5.5

## 2013-01-28 LAB — CBC WITH DIFFERENTIAL/PLATELET
Basophils Absolute: 0 10*3/uL (ref 0.0–0.1)
HCT: 41.9 % (ref 36.0–46.0)
Hemoglobin: 14.1 g/dL (ref 12.0–15.0)
Lymphs Abs: 2.1 10*3/uL (ref 0.7–4.0)
MCHC: 33.7 g/dL (ref 30.0–36.0)
Monocytes Relative: 6.2 % (ref 3.0–12.0)
Neutro Abs: 2.9 10*3/uL (ref 1.4–7.7)
RDW: 12.9 % (ref 11.5–14.6)

## 2013-01-28 LAB — BASIC METABOLIC PANEL
CO2: 30 mEq/L (ref 19–32)
Glucose, Bld: 99 mg/dL (ref 70–99)
Potassium: 4.9 mEq/L (ref 3.5–5.1)
Sodium: 137 mEq/L (ref 135–145)

## 2013-01-28 LAB — TSH: TSH: 3.72 u[IU]/mL (ref 0.35–5.50)

## 2013-02-03 ENCOUNTER — Encounter: Payer: Self-pay | Admitting: Family Medicine

## 2013-02-03 ENCOUNTER — Ambulatory Visit (INDEPENDENT_AMBULATORY_CARE_PROVIDER_SITE_OTHER): Payer: BC Managed Care – PPO | Admitting: Family Medicine

## 2013-02-03 VITALS — BP 110/60 | HR 81 | Temp 98.3°F | Ht 68.5 in | Wt 134.0 lb

## 2013-02-03 DIAGNOSIS — Z0000 Encounter for general adult medical examination without abnormal findings: Secondary | ICD-10-CM

## 2013-02-03 NOTE — Progress Notes (Signed)
  Subjective:    Patient ID: Caitlin Russell, female    DOB: 04/04/1971, 42 y.o.   MRN: [id number]  HPI 42 yr old female for a cpx. She is doing well.    Review of Systems  Constitutional: Negative.   HENT: Negative.   Eyes: Negative.   Respiratory: Negative.   Cardiovascular: Negative.   Gastrointestinal: Negative.   Genitourinary: Negative for dysuria, urgency, frequency, hematuria, flank pain, decreased urine volume, enuresis, difficulty urinating, pelvic pain and dyspareunia.  Musculoskeletal: Negative.   Skin: Negative.   Neurological: Negative.   Psychiatric/Behavioral: Negative.        Objective:   Physical Exam  Constitutional: She is oriented to person, place, and time. She appears well-developed and well-nourished. No distress.  HENT:  Head: Normocephalic and atraumatic.  Right Ear: External ear normal.  Left Ear: External ear normal.  Nose: Nose normal.  Mouth/Throat: Oropharynx is clear and moist. No oropharyngeal exudate.  Eyes: Conjunctivae and EOM are normal. Pupils are equal, round, and reactive to light. No scleral icterus.  Neck: Normal range of motion. Neck supple. No JVD present. No thyromegaly present.  Cardiovascular: Normal rate, regular rhythm, normal heart sounds and intact distal pulses.  Exam reveals no gallop and no friction rub.   No murmur heard. Pulmonary/Chest: Effort normal and breath sounds normal. No respiratory distress. She has no wheezes. She has no rales. She exhibits no tenderness.  Abdominal: Soft. Bowel sounds are normal. She exhibits no distension and no mass. There is no tenderness. There is no rebound and no guarding.  Musculoskeletal: Normal range of motion. She exhibits no edema and no tenderness.  Lymphadenopathy:    She has no cervical adenopathy.  Neurological: She is alert and oriented to person, place, and time. She has normal reflexes. No cranial nerve deficit. She exhibits normal muscle tone. Coordination normal.  Skin:  Skin is warm and dry. No rash noted. No erythema.  Psychiatric: She has a normal mood and affect. Her behavior is normal. Judgment and thought content normal.          Assessment & Plan:  Well exam.

## 2013-02-09 ENCOUNTER — Other Ambulatory Visit: Payer: Self-pay | Admitting: Family Medicine

## 2013-04-15 ENCOUNTER — Other Ambulatory Visit: Payer: Self-pay

## 2013-08-03 ENCOUNTER — Ambulatory Visit: Payer: BC Managed Care – PPO | Admitting: Family Medicine

## 2013-08-04 ENCOUNTER — Ambulatory Visit (INDEPENDENT_AMBULATORY_CARE_PROVIDER_SITE_OTHER): Payer: BC Managed Care – PPO | Admitting: Family Medicine

## 2013-08-04 ENCOUNTER — Encounter: Payer: Self-pay | Admitting: Family Medicine

## 2013-08-04 VITALS — BP 102/60 | HR 97 | Temp 98.6°F | Ht 68.5 in | Wt 140.0 lb

## 2013-08-04 DIAGNOSIS — M65849 Other synovitis and tenosynovitis, unspecified hand: Secondary | ICD-10-CM

## 2013-08-04 DIAGNOSIS — K148 Other diseases of tongue: Secondary | ICD-10-CM

## 2013-08-04 DIAGNOSIS — M778 Other enthesopathies, not elsewhere classified: Secondary | ICD-10-CM

## 2013-08-04 DIAGNOSIS — M65839 Other synovitis and tenosynovitis, unspecified forearm: Secondary | ICD-10-CM

## 2013-08-04 NOTE — Progress Notes (Signed)
Pre visit review using our clinic review tool, if applicable. No additional management support is needed unless otherwise documented below in the visit note. 

## 2013-08-04 NOTE — Progress Notes (Signed)
   Subjective:    Patient ID: Caitlin Russell, female    DOB: [birth year], 43 y.o.   MRN: [id number]  HPI Here for 2 things. First she has had a red spot on her tongue for the past 4 weeks. No hx of trauma. Sometimes it is sore,often it is not. The redness gets more intense at times and more dull at times. The size is not changing. Also for 3 months she has had intermittent pain in the right wrist. No hx of trauma. No swelling. The pain goes away if she wears a wrist brace for a few days but then it returns.    Review of Systems  Constitutional: Negative.   HENT: Positive for mouth sores.   Musculoskeletal: Positive for arthralgias.       Objective:   Physical Exam  Constitutional: She appears well-developed and well-nourished.  HENT:  There is a macular bright red area on the center of the top of the tongue, centered over the midline. papillation is normal. This is blanchable and not tender   Musculoskeletal:  The right wrist has a tender spot on the dorsal surface, no swelling. Full ROM.   Lymphadenopathy:    She has no cervical adenopathy.          Assessment & Plan:  She has tendonitis of the wrist and this should go away with sufficient immobilization. She will wear the splint 24 hours a day for 2 full weeks. Recheck prn. The tongue lesion seems to be hypervascular but I am not sure what this is. We will refer to ENT

## 2014-01-30 ENCOUNTER — Other Ambulatory Visit: Payer: Self-pay | Admitting: Family Medicine

## 2014-03-09 ENCOUNTER — Ambulatory Visit (INDEPENDENT_AMBULATORY_CARE_PROVIDER_SITE_OTHER): Payer: BC Managed Care – PPO | Admitting: Family Medicine

## 2014-03-09 DIAGNOSIS — Z23 Encounter for immunization: Secondary | ICD-10-CM

## 2014-05-02 ENCOUNTER — Telehealth: Payer: Self-pay | Admitting: Family Medicine

## 2014-05-02 NOTE — Telephone Encounter (Signed)
okay

## 2014-05-02 NOTE — Telephone Encounter (Signed)
FYIPt is coming in for blood work and request a refill on synthroid. Please do not refill until after blood work results are back. Pt is thinking med may need to be adjusted.

## 2014-05-04 ENCOUNTER — Other Ambulatory Visit (INDEPENDENT_AMBULATORY_CARE_PROVIDER_SITE_OTHER): Payer: BC Managed Care – PPO

## 2014-05-04 DIAGNOSIS — Z0000 Encounter for general adult medical examination without abnormal findings: Secondary | ICD-10-CM

## 2014-05-04 LAB — CBC WITH DIFFERENTIAL/PLATELET
BASOS ABS: 0 10*3/uL (ref 0.0–0.1)
Basophils Relative: 0.4 % (ref 0.0–3.0)
Eosinophils Absolute: 0.3 10*3/uL (ref 0.0–0.7)
Eosinophils Relative: 4 % (ref 0.0–5.0)
HCT: 41.5 % (ref 36.0–46.0)
Hemoglobin: 13.8 g/dL (ref 12.0–15.0)
LYMPHS ABS: 1.8 10*3/uL (ref 0.7–4.0)
LYMPHS PCT: 24.2 % (ref 12.0–46.0)
MCHC: 33.1 g/dL (ref 30.0–36.0)
MCV: 89.2 fl (ref 78.0–100.0)
MONOS PCT: 6.3 % (ref 3.0–12.0)
Monocytes Absolute: 0.5 10*3/uL (ref 0.1–1.0)
NEUTROS PCT: 65.1 % (ref 43.0–77.0)
Neutro Abs: 4.8 10*3/uL (ref 1.4–7.7)
PLATELETS: 183 10*3/uL (ref 150.0–400.0)
RBC: 4.65 Mil/uL (ref 3.87–5.11)
RDW: 13.1 % (ref 11.5–15.5)
WBC: 7.4 10*3/uL (ref 4.0–10.5)

## 2014-05-04 LAB — LIPID PANEL
CHOL/HDL RATIO: 3
Cholesterol: 125 mg/dL (ref 0–200)
HDL: 38.1 mg/dL — AB (ref >39.00)
LDL Cholesterol: 74 mg/dL (ref 0–99)
NonHDL: 86.9
TRIGLYCERIDES: 65 mg/dL (ref 0.0–149.0)
VLDL: 13 mg/dL (ref 0.0–40.0)

## 2014-05-04 LAB — BASIC METABOLIC PANEL
BUN: 12 mg/dL (ref 6–23)
CALCIUM: 9.2 mg/dL (ref 8.4–10.5)
CO2: 28 mEq/L (ref 19–32)
CREATININE: 0.7 mg/dL (ref 0.4–1.2)
Chloride: 102 mEq/L (ref 96–112)
GFR: 100.25 mL/min (ref >60.00)
GLUCOSE: 98 mg/dL (ref 70–99)
Potassium: 4.8 mEq/L (ref 3.5–5.1)
Sodium: 138 mEq/L (ref 135–145)

## 2014-05-04 LAB — HEPATIC FUNCTION PANEL
ALK PHOS: 30 U/L — AB (ref 39–117)
ALT: 15 U/L (ref 0–35)
AST: 14 U/L (ref 0–37)
Albumin: 4.5 g/dL (ref 3.5–5.2)
BILIRUBIN DIRECT: 0.1 mg/dL (ref 0.0–0.3)
BILIRUBIN TOTAL: 0.9 mg/dL (ref 0.2–1.2)
Total Protein: 7.3 g/dL (ref 6.0–8.3)

## 2014-05-04 LAB — TSH: TSH: 4.2 u[IU]/mL (ref 0.35–4.50)

## 2014-05-11 ENCOUNTER — Encounter: Payer: Self-pay | Admitting: Family Medicine

## 2014-05-11 ENCOUNTER — Ambulatory Visit (INDEPENDENT_AMBULATORY_CARE_PROVIDER_SITE_OTHER): Payer: BC Managed Care – PPO | Admitting: Family Medicine

## 2014-05-11 VITALS — BP 123/74 | HR 78 | Temp 99.0°F | Ht 68.5 in | Wt 136.0 lb

## 2014-05-11 DIAGNOSIS — Z0000 Encounter for general adult medical examination without abnormal findings: Secondary | ICD-10-CM

## 2014-05-11 LAB — POCT URINALYSIS DIPSTICK
BILIRUBIN UA: NEGATIVE
GLUCOSE UA: NEGATIVE
KETONES UA: NEGATIVE
NITRITE UA: NEGATIVE
Protein, UA: NEGATIVE
RBC UA: NEGATIVE
Spec Grav, UA: <=1.005
Urobilinogen, UA: 0.2
pH, UA: 5.5

## 2014-05-11 MED ORDER — SYNTHROID 50 MCG PO TABS: ORAL_TABLET | ORAL | Status: DC

## 2014-05-11 NOTE — Progress Notes (Signed)
   Subjective:    Patient ID: Caitlin Russell, female    DOB: [birth year], 43 y.o.   MRN: [id number]  HPI 42 yr old female here for a cpx. She has a few items to discuss. She asks about what she can do to help her lower back. About one month ago while lifting a bag of landscaping stones into her car she felt a sharp pain in the lower back and her back stiffened up later that day. That night she had more severe pains which were helped a bit with Ibuprofen, heat, and Flexeril. The pain slowly went away over the next week, but then a week after that it came back. While stepping [place], she again felt a sudden severe pain in the lower back, and again this slowly went away over the next few days. Today she feels fine.   Review of Systems  Constitutional: Negative.   HENT: Positive for ear discharge.   Eyes: Negative.   Respiratory: Negative.   Cardiovascular: Negative.   Gastrointestinal: Negative.   Genitourinary: Negative for dysuria, urgency, frequency, hematuria, flank pain, decreased urine volume, enuresis, difficulty urinating, pelvic pain and dyspareunia.  Musculoskeletal: Positive for back pain. Negative for myalgias, joint swelling, arthralgias, gait problem, neck pain and neck stiffness.  Skin: Negative.   Neurological: Negative.   Psychiatric/Behavioral: Negative.        Objective:   Physical Exam  Constitutional: She is oriented to person, place, and time. She appears well-developed and well-nourished. No distress.  HENT:  Head: Normocephalic and atraumatic.  Right Ear: External ear normal.  Left Ear: External ear normal.  Nose: Nose normal.  Mouth/Throat: Oropharynx is clear and moist. No oropharyngeal exudate.  Eyes: Conjunctivae and EOM are normal. Pupils are equal, round, and reactive to light. No scleral icterus.  Neck: Normal range of motion. Neck supple. No JVD present. No thyromegaly present.  Cardiovascular: Normal rate, regular rhythm,  normal heart sounds and intact distal pulses.  Exam reveals no gallop and no friction rub.   No murmur heard. Pulmonary/Chest: Effort normal and breath sounds normal. No respiratory distress. She has no wheezes. She has no rales. She exhibits no tenderness.  Abdominal: Soft. Bowel sounds are normal. She exhibits no distension and no mass. There is no tenderness. There is no rebound and no guarding.  Musculoskeletal: Normal range of motion. She exhibits no edema or tenderness.  Lymphadenopathy:    She has no cervical adenopathy.  Neurological: She is alert and oriented to person, place, and time. She has normal reflexes. No cranial nerve deficit. She exhibits normal muscle tone. Coordination normal.  Skin: Skin is warm and dry. No rash noted. No erythema.  Psychiatric: She has a normal mood and affect. Her behavior is normal. Judgment and thought content normal.          Assessment & Plan:  Well exam. I suggested she try either Pilates classes or yoga to strengthen her core muscles. Recheck prn

## 2014-05-11 NOTE — Progress Notes (Signed)
Pre visit review using our clinic review tool, if applicable. No additional management support is needed unless otherwise documented below in the visit note. 

## 2015-01-16 ENCOUNTER — Telehealth: Payer: Self-pay | Admitting: Family Medicine

## 2015-01-16 NOTE — Telephone Encounter (Signed)
Patient called and  stated that they will be traveling and wanted to know if he needed the following injections Hep A & B, yellow fever, typhoid, and malaria. Please give patient a call.

## 2015-01-16 NOTE — Telephone Encounter (Signed)
I spoke with pt and she does not have Hep A or Hep B injections listed in her chart. I advised pt to contact the [facility] Department to find out if these vaccines are needed and if so then she can get there.

## 2015-04-07 ENCOUNTER — Ambulatory Visit (INDEPENDENT_AMBULATORY_CARE_PROVIDER_SITE_OTHER): Payer: BLUE CROSS/BLUE SHIELD | Admitting: Family Medicine

## 2015-04-07 DIAGNOSIS — Z23 Encounter for immunization: Secondary | ICD-10-CM

## 2015-04-13 ENCOUNTER — Other Ambulatory Visit: Payer: Self-pay | Admitting: Family Medicine

## 2015-05-17 ENCOUNTER — Other Ambulatory Visit (INDEPENDENT_AMBULATORY_CARE_PROVIDER_SITE_OTHER): Payer: BLUE CROSS/BLUE SHIELD

## 2015-05-17 DIAGNOSIS — Z0000 Encounter for general adult medical examination without abnormal findings: Secondary | ICD-10-CM | POA: Diagnosis not present

## 2015-05-17 LAB — CBC WITH DIFFERENTIAL/PLATELET
Basophils Absolute: 0 10*3/uL (ref 0.0–0.1)
Basophils Relative: 0.5 % (ref 0.0–3.0)
EOS ABS: 0.2 10*3/uL (ref 0.0–0.7)
Eosinophils Relative: 3 % (ref 0.0–5.0)
HEMATOCRIT: 43.8 % (ref 36.0–46.0)
HEMOGLOBIN: 14.5 g/dL (ref 12.0–15.0)
LYMPHS ABS: 2.1 10*3/uL (ref 0.7–4.0)
Lymphocytes Relative: 31.5 % (ref 12.0–46.0)
MCHC: 33 g/dL (ref 30.0–36.0)
MCV: 88.5 fl (ref 78.0–100.0)
MONO ABS: 0.4 10*3/uL (ref 0.1–1.0)
Monocytes Relative: 5.8 % (ref 3.0–12.0)
NEUTROS ABS: 3.9 10*3/uL (ref 1.4–7.7)
Neutrophils Relative %: 59.2 % (ref 43.0–77.0)
PLATELETS: 186 10*3/uL (ref 150.0–400.0)
RBC: 4.95 Mil/uL (ref 3.87–5.11)
RDW: 13 % (ref 11.5–15.5)
WBC: 6.7 10*3/uL (ref 4.0–10.5)

## 2015-05-17 LAB — HEPATIC FUNCTION PANEL
ALT: 14 U/L (ref 0–35)
AST: 14 U/L (ref 0–37)
Albumin: 4.4 g/dL (ref 3.5–5.2)
Alkaline Phosphatase: 30 U/L — ABNORMAL LOW (ref 39–117)
BILIRUBIN DIRECT: 0.2 mg/dL (ref 0.0–0.3)
TOTAL PROTEIN: 7.2 g/dL (ref 6.0–8.3)
Total Bilirubin: 1 mg/dL (ref 0.2–1.2)

## 2015-05-17 LAB — BASIC METABOLIC PANEL
BUN: 11 mg/dL (ref 6–23)
CALCIUM: 9.5 mg/dL (ref 8.4–10.5)
CO2: 30 meq/L (ref 19–32)
CREATININE: 0.77 mg/dL (ref 0.40–1.20)
Chloride: 102 mEq/L (ref 96–112)
GFR: 86.44 mL/min (ref >60.00)
GLUCOSE: 107 mg/dL — AB (ref 70–99)
Potassium: 5.1 mEq/L (ref 3.5–5.1)
SODIUM: 138 meq/L (ref 135–145)

## 2015-05-17 LAB — LIPID PANEL
CHOLESTEROL: 147 mg/dL (ref 0–200)
HDL: 47.2 mg/dL (ref >39.00)
LDL Cholesterol: 85 mg/dL (ref 0–99)
NONHDL: 100.05
Total CHOL/HDL Ratio: 3
Triglycerides: 73 mg/dL (ref 0.0–149.0)
VLDL: 14.6 mg/dL (ref 0.0–40.0)

## 2015-05-17 LAB — POCT URINALYSIS DIPSTICK
Bilirubin, UA: NEGATIVE
Glucose, UA: NEGATIVE
KETONES UA: NEGATIVE
Nitrite, UA: NEGATIVE
PH UA: 5.5
PROTEIN UA: NEGATIVE
RBC UA: NEGATIVE
SPEC GRAV UA: 1.015
UROBILINOGEN UA: 0.2

## 2015-05-17 LAB — TSH: TSH: 4.08 u[IU]/mL (ref 0.35–4.50)

## 2015-05-24 ENCOUNTER — Ambulatory Visit (INDEPENDENT_AMBULATORY_CARE_PROVIDER_SITE_OTHER): Payer: BLUE CROSS/BLUE SHIELD | Admitting: Family Medicine

## 2015-05-24 ENCOUNTER — Encounter: Payer: Self-pay | Admitting: Family Medicine

## 2015-05-24 VITALS — BP 109/77 | HR 79 | Temp 98.2°F | Ht 68.5 in | Wt 138.0 lb

## 2015-05-24 DIAGNOSIS — Z0000 Encounter for general adult medical examination without abnormal findings: Secondary | ICD-10-CM | POA: Diagnosis not present

## 2015-05-24 NOTE — Progress Notes (Signed)
   Subjective:    Patient ID: Caitlin Russell, female    DOB: 06-06-1971, 44 y.o.   MRN: [id number]  HPI 44 yr old female for a cpx. She feels well in general.    Review of Systems  Constitutional: Negative.   HENT: Negative.   Eyes: Negative.   Respiratory: Negative.   Cardiovascular: Negative.   Gastrointestinal: Negative.   Genitourinary: Negative for dysuria, urgency, frequency, hematuria, flank pain, decreased urine volume, enuresis, difficulty urinating, pelvic pain and dyspareunia.  Musculoskeletal: Negative.   Skin: Negative.   Neurological: Negative.   Psychiatric/Behavioral: Negative.        Objective:   Physical Exam  Constitutional: She is oriented to person, place, and time. She appears well-developed and well-nourished. No distress.  HENT:  Head: Normocephalic and atraumatic.  Right Ear: External ear normal.  Left Ear: External ear normal.  Nose: Nose normal.  Mouth/Throat: Oropharynx is clear and moist. No oropharyngeal exudate.  Eyes: Conjunctivae and EOM are normal. Pupils are equal, round, and reactive to light. No scleral icterus.  Neck: Normal range of motion. Neck supple. No JVD present. No thyromegaly present.  Cardiovascular: Normal rate, regular rhythm, normal heart sounds and intact distal pulses.  Exam reveals no gallop and no friction rub.   No murmur heard. Pulmonary/Chest: Effort normal and breath sounds normal. No respiratory distress. She has no wheezes. She has no rales. She exhibits no tenderness.  Abdominal: Soft. Bowel sounds are normal. She exhibits no distension and no mass. There is no tenderness. There is no rebound and no guarding.  Musculoskeletal: Normal range of motion. She exhibits no edema or tenderness.  Lymphadenopathy:    She has no cervical adenopathy.  Neurological: She is alert and oriented to person, place, and time. She has normal reflexes. No cranial nerve deficit. She exhibits normal muscle tone. Coordination normal.    Skin: Skin is warm and dry. No rash noted. No erythema.  Psychiatric: She has a normal mood and affect. Her behavior is normal. Judgment and thought content normal.          Assessment & Plan:  Well exam. We discussed diet and exercise.

## 2015-05-24 NOTE — Progress Notes (Signed)
Pre visit review using our clinic review tool, if applicable. No additional management support is needed unless otherwise documented below in the visit note. 

## 2016-03-29 ENCOUNTER — Other Ambulatory Visit: Payer: Self-pay | Admitting: Family Medicine

## 2016-12-16 ENCOUNTER — Encounter: Payer: Self-pay | Admitting: Family Medicine

## 2016-12-16 ENCOUNTER — Ambulatory Visit (INDEPENDENT_AMBULATORY_CARE_PROVIDER_SITE_OTHER): Payer: BLUE CROSS/BLUE SHIELD | Admitting: Family Medicine

## 2016-12-16 VITALS — BP 100/60 | Temp 97.6°F | Ht 68.5 in | Wt 140.0 lb

## 2016-12-16 DIAGNOSIS — Z0000 Encounter for general adult medical examination without abnormal findings: Secondary | ICD-10-CM | POA: Diagnosis not present

## 2016-12-16 LAB — LIPID PANEL
CHOL/HDL RATIO: 3
Cholesterol: 154 mg/dL (ref 0–200)
HDL: 46 mg/dL (ref >39.00)
LDL CALC: 91 mg/dL (ref 0–99)
NONHDL: 107.77
TRIGLYCERIDES: 83 mg/dL (ref 0.0–149.0)
VLDL: 16.6 mg/dL (ref 0.0–40.0)

## 2016-12-16 LAB — CBC WITH DIFFERENTIAL/PLATELET
BASOS PCT: 0.6 % (ref 0.0–3.0)
Basophils Absolute: 0 10*3/uL (ref 0.0–0.1)
EOS PCT: 3.7 % (ref 0.0–5.0)
Eosinophils Absolute: 0.2 10*3/uL (ref 0.0–0.7)
HCT: 43.1 % (ref 36.0–46.0)
Hemoglobin: 14.5 g/dL (ref 12.0–15.0)
Lymphocytes Relative: 30.7 % (ref 12.0–46.0)
Lymphs Abs: 1.9 10*3/uL (ref 0.7–4.0)
MCHC: 33.7 g/dL (ref 30.0–36.0)
MCV: 88.4 fl (ref 78.0–100.0)
MONO ABS: 0.4 10*3/uL (ref 0.1–1.0)
Monocytes Relative: 5.7 % (ref 3.0–12.0)
Neutro Abs: 3.7 10*3/uL (ref 1.4–7.7)
Neutrophils Relative %: 59.3 % (ref 43.0–77.0)
Platelets: 210 10*3/uL (ref 150.0–400.0)
RBC: 4.87 Mil/uL (ref 3.87–5.11)
RDW: 12.8 % (ref 11.5–15.5)
WBC: 6.2 10*3/uL (ref 4.0–10.5)

## 2016-12-16 LAB — HEPATIC FUNCTION PANEL
ALT: 11 U/L (ref 0–35)
AST: 11 U/L (ref 0–37)
Albumin: 4.7 g/dL (ref 3.5–5.2)
Alkaline Phosphatase: 35 U/L — ABNORMAL LOW (ref 39–117)
BILIRUBIN DIRECT: 0.2 mg/dL (ref 0.0–0.3)
BILIRUBIN TOTAL: 0.9 mg/dL (ref 0.2–1.2)
Total Protein: 7.2 g/dL (ref 6.0–8.3)

## 2016-12-16 LAB — TSH: TSH: 3.42 u[IU]/mL (ref 0.35–4.50)

## 2016-12-16 LAB — BASIC METABOLIC PANEL
BUN: 12 mg/dL (ref 6–23)
CHLORIDE: 100 meq/L (ref 96–112)
CO2: 30 mEq/L (ref 19–32)
CREATININE: 0.75 mg/dL (ref 0.40–1.20)
Calcium: 9.7 mg/dL (ref 8.4–10.5)
GFR: 88.47 mL/min (ref >60.00)
Glucose, Bld: 116 mg/dL — ABNORMAL HIGH (ref 70–99)
Potassium: 4.1 mEq/L (ref 3.5–5.1)
Sodium: 138 mEq/L (ref 135–145)

## 2016-12-16 NOTE — Patient Instructions (Signed)
WE NOW OFFER   [facility] FAST TRACK!!!  SAME DAY Appointments for ACUTE CARE  Such as: Sprains, Injuries, cuts, abrasions, rashes, muscle pain, joint pain, back pain Colds, flu, sore throats, headache, allergies, cough, fever  Ear pain, sinus and eye infections Abdominal pain, nausea, vomiting, diarrhea, upset stomach Animal/insect bites  3 Easy Ways to Schedule: Walk-In Scheduling Call in scheduling Mychart Sign-up: [email or web address]         

## 2016-12-16 NOTE — Progress Notes (Signed)
   Subjective:    Patient ID: Caitlin Russell, female    DOB: 19-Jan-1971, 46 y.o.   MRN: [id number]  HPI 46 yr old female for a well exam. She feels great.    Review of Systems  Constitutional: Negative.   HENT: Negative.   Eyes: Negative.   Respiratory: Negative.   Cardiovascular: Negative.   Gastrointestinal: Negative.   Genitourinary: Negative for decreased urine volume, difficulty urinating, dyspareunia, dysuria, enuresis, flank pain, frequency, hematuria, pelvic pain and urgency.  Musculoskeletal: Negative.   Skin: Negative.   Neurological: Negative.   Psychiatric/Behavioral: Negative.        Objective:   Physical Exam  Constitutional: She is oriented to person, place, and time. She appears well-developed and well-nourished. No distress.  HENT:  Head: Normocephalic and atraumatic.  Right Ear: External ear normal.  Left Ear: External ear normal.  Nose: Nose normal.  Mouth/Throat: Oropharynx is clear and moist. No oropharyngeal exudate.  Eyes: Conjunctivae and EOM are normal. Pupils are equal, round, and reactive to light. No scleral icterus.  Neck: Normal range of motion. Neck supple. No JVD present. No thyromegaly present.  Cardiovascular: Normal rate, regular rhythm, normal heart sounds and intact distal pulses.  Exam reveals no gallop and no friction rub.   No murmur heard. Pulmonary/Chest: Effort normal and breath sounds normal. No respiratory distress. She has no wheezes. She has no rales. She exhibits no tenderness.  Abdominal: Soft. Bowel sounds are normal. She exhibits no distension and no mass. There is no tenderness. There is no rebound and no guarding.  Musculoskeletal: Normal range of motion. She exhibits no edema or tenderness.  Lymphadenopathy:    She has no cervical adenopathy.  Neurological: She is alert and oriented to person, place, and time. She has normal reflexes. No cranial nerve deficit. She exhibits normal muscle tone. Coordination normal.  Skin:  Skin is warm and dry. No rash noted. No erythema.  Psychiatric: She has a normal mood and affect. Her behavior is normal. Judgment and thought content normal.          Assessment & Plan:  Well exam. We discussed diet and exercise. Get fasting labs.  Alysia Penna, MD

## 2017-01-24 ENCOUNTER — Encounter: Payer: Self-pay | Admitting: Family Medicine

## 2017-01-24 ENCOUNTER — Ambulatory Visit (INDEPENDENT_AMBULATORY_CARE_PROVIDER_SITE_OTHER): Payer: BLUE CROSS/BLUE SHIELD | Admitting: Family Medicine

## 2017-01-24 VITALS — BP 120/90 | HR 107 | Temp 98.8°F | Ht 68.5 in | Wt 139.0 lb

## 2017-01-24 DIAGNOSIS — J209 Acute bronchitis, unspecified: Secondary | ICD-10-CM | POA: Diagnosis not present

## 2017-01-24 MED ORDER — AZITHROMYCIN 250 MG PO TABS: ORAL_TABLET | ORAL | 0 refills | Status: DC

## 2017-01-24 NOTE — Progress Notes (Signed)
   Subjective:    Patient ID: Caitlin Russell, female    DOB: 1971-01-04, 46 y.o.   MRN: [id number]  HPI Here for 5 days of stuffy head, PND, ST, chest tightness and coughing up green sputum. On Robitussin.    Review of Systems  Constitutional: Negative.   HENT: Positive for postnasal drip, sinus pressure and sore throat. Negative for sinus pain.   Eyes: Negative.   Respiratory: Positive for cough and chest tightness. Negative for shortness of breath.   Cardiovascular: Negative for chest pain, palpitations and leg swelling.       Objective:   Physical Exam  Constitutional: She appears well-developed and well-nourished.  HENT:  Right Ear: External ear normal.  Left Ear: External ear normal.  Nose: Nose normal.  Posterior OP is red without exudate   Eyes: Pupils are equal, round, and reactive to light. Conjunctivae are normal.  Neck: Neck supple. No thyromegaly present.  Cardiovascular: Normal rate, regular rhythm, normal heart sounds and intact distal pulses.   Pulmonary/Chest: Effort normal. No respiratory distress. She has no wheezes. She has no rales.  Scattered rhonchi   Lymphadenopathy:    She has no cervical adenopathy.          Assessment & Plan:  Bronchitis, treat with a Zpack.  Alysia Penna, MD

## 2017-01-24 NOTE — Patient Instructions (Signed)
WE NOW OFFER   [facility] FAST TRACK!!!  SAME DAY Appointments for ACUTE CARE  Such as: Sprains, Injuries, cuts, abrasions, rashes, muscle pain, joint pain, back pain Colds, flu, sore throats, headache, allergies, cough, fever  Ear pain, sinus and eye infections Abdominal pain, nausea, vomiting, diarrhea, upset stomach Animal/insect bites  3 Easy Ways to Schedule: Walk-In Scheduling Call in scheduling Mychart Sign-up: [email or web address]         

## 2017-01-30 ENCOUNTER — Other Ambulatory Visit: Payer: Self-pay | Admitting: Family Medicine

## 2017-02-05 ENCOUNTER — Telehealth: Payer: Self-pay | Admitting: Family Medicine

## 2017-02-05 MED ORDER — LEVOFLOXACIN 500 MG PO TABS: 500.0000 mg | ORAL_TABLET | Freq: Every day | ORAL | 0 refills | Status: DC

## 2017-02-05 NOTE — Telephone Encounter (Signed)
Pt seen 8/17 and given a zpak. Sore throat got better, but has now returned. Pt wants to know what to do, get another Rx or does she have to be seen?    [id number] [place] [place] - [id number]

## 2017-02-05 NOTE — Telephone Encounter (Signed)
Call in Levaquin 500 mg daily for 10 days  

## 2017-02-05 NOTE — Telephone Encounter (Signed)
[facility] and spoke with pt.  

## 2017-02-27 ENCOUNTER — Encounter: Payer: Self-pay | Admitting: Family Medicine

## 2017-04-18 ENCOUNTER — Other Ambulatory Visit: Payer: Self-pay | Admitting: Family Medicine

## 2018-01-12 ENCOUNTER — Encounter: Payer: BLUE CROSS/BLUE SHIELD | Admitting: Family Medicine

## 2018-01-20 ENCOUNTER — Encounter: Payer: Self-pay | Admitting: Family Medicine

## 2018-01-20 ENCOUNTER — Ambulatory Visit (INDEPENDENT_AMBULATORY_CARE_PROVIDER_SITE_OTHER): Payer: No Typology Code available for payment source | Admitting: Family Medicine

## 2018-01-20 VITALS — BP 102/68 | HR 92 | Temp 98.0°F | Ht 68.5 in | Wt 127.2 lb

## 2018-01-20 DIAGNOSIS — Z0000 Encounter for general adult medical examination without abnormal findings: Secondary | ICD-10-CM | POA: Diagnosis not present

## 2018-01-20 DIAGNOSIS — E039 Hypothyroidism, unspecified: Secondary | ICD-10-CM

## 2018-01-20 DIAGNOSIS — Z23 Encounter for immunization: Secondary | ICD-10-CM

## 2018-01-20 LAB — CBC WITH DIFFERENTIAL/PLATELET
BASOS PCT: 0.6 % (ref 0.0–3.0)
Basophils Absolute: 0 10*3/uL (ref 0.0–0.1)
EOS ABS: 0.1 10*3/uL (ref 0.0–0.7)
Eosinophils Relative: 2.3 % (ref 0.0–5.0)
HEMATOCRIT: 39.5 % (ref 36.0–46.0)
HEMOGLOBIN: 13.3 g/dL (ref 12.0–15.0)
LYMPHS PCT: 25.3 % (ref 12.0–46.0)
Lymphs Abs: 1.6 10*3/uL (ref 0.7–4.0)
MCHC: 33.8 g/dL (ref 30.0–36.0)
MCV: 88.7 fl (ref 78.0–100.0)
Monocytes Absolute: 0.4 10*3/uL (ref 0.1–1.0)
Monocytes Relative: 5.9 % (ref 3.0–12.0)
Neutro Abs: 4.3 10*3/uL (ref 1.4–7.7)
Neutrophils Relative %: 65.9 % (ref 43.0–77.0)
Platelets: 191 10*3/uL (ref 150.0–400.0)
RBC: 4.45 Mil/uL (ref 3.87–5.11)
RDW: 13.3 % (ref 11.5–15.5)
WBC: 6.5 10*3/uL (ref 4.0–10.5)

## 2018-01-20 LAB — HEPATIC FUNCTION PANEL
ALT: 11 U/L (ref 0–35)
AST: 10 U/L (ref 0–37)
Albumin: 4.3 g/dL (ref 3.5–5.2)
Alkaline Phosphatase: 23 U/L — ABNORMAL LOW (ref 39–117)
BILIRUBIN DIRECT: 0.1 mg/dL (ref 0.0–0.3)
BILIRUBIN TOTAL: 0.7 mg/dL (ref 0.2–1.2)
TOTAL PROTEIN: 6.6 g/dL (ref 6.0–8.3)

## 2018-01-20 LAB — LIPID PANEL
CHOL/HDL RATIO: 3
Cholesterol: 147 mg/dL (ref 0–200)
HDL: 49.1 mg/dL (ref >39.00)
LDL Cholesterol: 89 mg/dL (ref 0–99)
NONHDL: 98.09
Triglycerides: 47 mg/dL (ref 0.0–149.0)
VLDL: 9.4 mg/dL (ref 0.0–40.0)

## 2018-01-20 LAB — T4, FREE: FREE T4: 0.98 ng/dL (ref 0.60–1.60)

## 2018-01-20 LAB — BASIC METABOLIC PANEL
BUN: 11 mg/dL (ref 6–23)
CALCIUM: 9.2 mg/dL (ref 8.4–10.5)
CO2: 31 meq/L (ref 19–32)
CREATININE: 0.65 mg/dL (ref 0.40–1.20)
Chloride: 101 mEq/L (ref 96–112)
GFR: 103.86 mL/min (ref >60.00)
Glucose, Bld: 112 mg/dL — ABNORMAL HIGH (ref 70–99)
Potassium: 4 mEq/L (ref 3.5–5.1)
Sodium: 137 mEq/L (ref 135–145)

## 2018-01-20 LAB — TSH: TSH: 2.14 u[IU]/mL (ref 0.35–4.50)

## 2018-01-20 LAB — T3, FREE: T3, Free: 3.5 pg/mL (ref 2.3–4.2)

## 2018-01-20 MED ORDER — AZELASTINE HCL 0.1 % NA SOLN: 2.0000 | Freq: Two times a day (BID) | NASAL | 11 refills | Status: AC

## 2018-01-20 NOTE — Progress Notes (Signed)
   Subjective:    Patient ID: Caitlin Russell, female    DOB: May 10, 1971, 47 y.o.   MRN: [id number]  HPI Here for a well exam. She has a few issues to discuss. First Flonase no longer helps her nasal congestion and she wants to try something else. Also she has intermittent pains in the left hip area. No hx of trauma. She often has stiffness in the lateral hip when she gets up from sitting and then this improves as she moves around.    Review of Systems  Constitutional: Negative.   HENT: Positive for congestion.   Eyes: Negative.   Respiratory: Negative.   Cardiovascular: Negative.   Gastrointestinal: Negative.   Genitourinary: Negative for decreased urine volume, difficulty urinating, dyspareunia, dysuria, enuresis, flank pain, frequency, hematuria, pelvic pain and urgency.  Musculoskeletal: Positive for arthralgias.  Skin: Negative.   Neurological: Negative.   Psychiatric/Behavioral: Negative.        Objective:   Physical Exam  Constitutional: She is oriented to person, place, and time. She appears well-developed and well-nourished. No distress.  HENT:  Head: Normocephalic and atraumatic.  Right Ear: External ear normal.  Left Ear: External ear normal.  Nose: Nose normal.  Mouth/Throat: Oropharynx is clear and moist. No oropharyngeal exudate.  Eyes: Pupils are equal, round, and reactive to light. Conjunctivae and EOM are normal. No scleral icterus.  Neck: Normal range of motion. Neck supple. No JVD present. No thyromegaly present.  Cardiovascular: Normal rate, regular rhythm, normal heart sounds and intact distal pulses. Exam reveals no gallop and no friction rub.  No murmur heard. Pulmonary/Chest: Effort normal and breath sounds normal. No respiratory distress. She has no wheezes. She has no rales. She exhibits no tenderness.  Abdominal: Soft. Bowel sounds are normal. She exhibits no distension and no mass. There is no tenderness. There is no rebound and no guarding.    Musculoskeletal: Normal range of motion. She exhibits no edema.  She is tender over the left greater trochanter, no swelling, full ROM   Lymphadenopathy:    She has no cervical adenopathy.  Neurological: She is alert and oriented to person, place, and time. She has normal reflexes. She displays normal reflexes. No cranial nerve deficit. She exhibits normal muscle tone. Coordination normal.  Skin: Skin is warm and dry. No rash noted. No erythema.  Psychiatric: She has a normal mood and affect. Her behavior is normal. Judgment and thought content normal.          Assessment & Plan:  Well exam. We discussed diet and exercise. Get fasting labs. Try Astelin nasal spray. She has trochanteric bursitis and she can use ice and Ibuprofen prn.  Alysia Penna, MD

## 2018-04-05 ENCOUNTER — Telehealth: Payer: Self-pay | Admitting: Family Medicine

## 2018-04-16 NOTE — Telephone Encounter (Signed)
Pt called to f/up on this request.   °

## 2018-04-27 ENCOUNTER — Other Ambulatory Visit: Payer: Self-pay | Admitting: *Deleted

## 2018-04-27 MED ORDER — SYNTHROID 50 MCG PO TABS: ORAL_TABLET | ORAL | 3 refills | Status: AC

## 2018-04-27 NOTE — Telephone Encounter (Signed)
Medication resent to requested pharmacy.

## 2018-04-27 NOTE — Progress Notes (Signed)
Medication resent to requested pharmacy.

## 2018-04-27 NOTE — Telephone Encounter (Signed)
Patient said that the pharmacy did not receive SYNTHROID 50 MCG tablet. It says transmission failed to pharmacy, can that be resent.  [facility] [place]

## 2019-03-31 ENCOUNTER — Other Ambulatory Visit: Payer: Self-pay | Admitting: Obstetrics and Gynecology

## 2019-03-31 DIAGNOSIS — R928 Other abnormal and inconclusive findings on diagnostic imaging of breast: Secondary | ICD-10-CM

## 2019-04-06 ENCOUNTER — Other Ambulatory Visit: Payer: Self-pay

## 2019-04-06 ENCOUNTER — Ambulatory Visit
Admission: RE | Admit: 2019-04-06 | Discharge: 2019-04-06 | Disposition: A | Discharge status: Home / Self Care | Payer: No Typology Code available for payment source | Source: Ambulatory Visit | Attending: Obstetrics and Gynecology | Admitting: Obstetrics and Gynecology

## 2019-04-06 DIAGNOSIS — R928 Other abnormal and inconclusive findings on diagnostic imaging of breast: Secondary | ICD-10-CM
